# Patient Record
Sex: Male | Born: 1955 | Race: White | Hispanic: No | Marital: Single
Health system: Midwestern US, Academic
[De-identification: ages and names within clinical notes are randomized; demographics above are authoritative.]

---

## 2019-04-08 ENCOUNTER — Encounter: Admit: 2019-04-08 | Discharge: 2019-04-08

## 2019-04-08 ENCOUNTER — Encounter: Admit: 2019-04-08 | Discharge: 2019-04-09

## 2019-04-20 ENCOUNTER — Encounter: Admit: 2019-04-20 | Discharge: 2019-04-21

## 2019-04-26 ENCOUNTER — Encounter: Admit: 2019-04-26 | Discharge: 2019-04-26

## 2019-04-26 DIAGNOSIS — C4490 Unspecified malignant neoplasm of skin, unspecified: Secondary | ICD-10-CM

## 2019-04-26 DIAGNOSIS — M549 Dorsalgia, unspecified: Secondary | ICD-10-CM

## 2019-04-26 DIAGNOSIS — C159 Malignant neoplasm of esophagus, unspecified: Secondary | ICD-10-CM

## 2019-04-26 NOTE — Progress Notes
Date of Service: 04/26/2019      Subjective:             Reason for Visit:  New CA Pt      Jason Garner is a 63 y.o. male         History of Present Illness    Received records from [facility] for consult with Medical Oncology at our Cancer Center.  I reviewed the records as outlined below. I spent greater than 31 minutes reviewing the below records. The records that were obtained/reviewed/summarized are scanned into 02 in outside records / media tab / CareEverywhere tab.    Oncologic history is as summarized below.    Dysphagia - 1-2 months. Has to blend food. Has trouble with liquids. Lost 40 pounds x 2 months.     04/02/2019 EGD showed circumferential mass at the GE junction, causing near obstruction.  The scope could not be passed beyond.  It was dilated and the swelling improved partially.   Biopsy revealed an esophageal adenocarcinoma.  PD-L1 CPS score is equal to 5%.  HER-2/neu is 2+ by IHC.  FISH results pending.      CT chest abdomen pelvis on 04/08/2019.  Long segment circumferential distal esophageal wall thickening.  Pleural-parenchymal scarring with right within the right lung apex also measuring 1.9 cm spiculated suspicious mass.    04/20/2019.  PET scan No PET uptake in the right upper lobe lung nodule..  Along the distal third of the esophagus above the GE junction there is evidence of abnormal radiopharmaceutical uptake along with thickened portion of the esophagus.  SUV 14.3.  Large gastrohepatic lymph node 2.3 cm with SUV 10.3.   Calcified lymph nodes are consistent with previous granulomatous disease.  There is uptake in the subcarinal region adjacent to a calcified subcarinal lymph node SUV 5.1.    04/22/2019.  IR guided right lung core biopsy revealed a well-differentiated adenocarcinoma.  CK7 positive.  CK20 negative.  TTF-1 positive.  AE1/AE3 positive.  MART-1 negative.  The tumor demonstrates in part a lipidic pattern and is primary to the lung. 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  He is physically active.  He continues to report dysphagia.  He is grinding up all his food to swallow.  He starting to have trouble with thick liquids as well.  He continues to lose weight.           Review of Systems   Constitutional: Positive for unexpected weight change. Negative for appetite change, chills, fatigue and fever.   HENT: Positive for trouble swallowing. Negative for sore throat.    Respiratory: Negative for cough and shortness of breath.    Cardiovascular: Negative for chest pain and leg swelling.   Gastrointestinal: Negative for abdominal pain, blood in stool, constipation, diarrhea, nausea and vomiting.   Genitourinary: Negative for dysuria and hematuria.   Musculoskeletal: Negative for back pain.   Skin: Negative for rash.   Neurological: Negative for headaches.   Psychiatric/Behavioral: Negative for sleep disturbance.             Medical History:   Diagnosis Date   ??? Back pain    ??? Cancer of esophagus (HCC)    ??? Skin cancer      Surgical History:   Procedure Laterality Date   ??? COLONOSCOPY     ??? ESOPHAGUS SURGERY       Family History   Problem Relation Age of Onset   ???  Cancer Father      Social History     Socioeconomic History   ??? Marital status: Single     Spouse name: Not on file   ??? Number of children: Not on file   ??? Years of education: Not on file   ??? Highest education level: Not on file   Occupational History   ??? Not on file   Tobacco Use   ??? Smoking status: Current Every Day Smoker     Packs/day: 1.00     Years: 35.00     Pack years: 35.00     Types: Cigarettes   ??? Smokeless tobacco: Never Used   Substance and Sexual Activity   ??? Alcohol use: Yes     Comment: occasionally    ??? Drug use: Never   ??? Sexual activity: Not on file   Other Topics Concern   ??? Not on file   Social History Narrative   ??? Not on file         Objective:         ??? omeprazole DR (PRILOSEC) 20 mg capsule 20 mg.     Vitals: 04/26/19 1414   BP: 127/72   BP Source: Arm, Left Lower   Patient Position: Sitting   Temp: (!) 34.8 ???C (94.6 ???F)   Weight: 67.6 kg (149 lb)   PainSc: Six     Body mass index is 19.66 kg/m???.     Pain Score: Six  Pain Loc: Abdomen      Pain Addressed:  Current regimen working to control pain.    Patient Evaluated for a Clinical Trial: Patient not eligible for a treatment trial (including not needing treatment, needs palliative care, in remission).     [place] Cooperative Oncology Group performance status is 1, Restricted in physically strenuous activity but ambulatory and able to carry out work of a light or sedentary nature, e.g., light house work, office work.     Physical Exam  Vitals signs and nursing note reviewed.   Constitutional:       Appearance: Normal appearance. He is normal weight.   HENT:      Head: Normocephalic and atraumatic.      Nose: Nose normal.   Eyes:      Extraocular Movements: Extraocular movements intact.   Neurological:      Mental Status: He is alert and oriented to person, place, and time.   Psychiatric:         Mood and Affect: Mood normal.         Behavior: Behavior normal.         Thought Content: Thought content normal.         Judgment: Judgment normal.            CBC w/DIFF  CBC with Diff Latest Ref Rng & Units 12/10/2012   WBC 4.5 - 11.0 K/UL 6.5   RBC 4.4 - 5.5 M/UL 4.73   HGB 13.5 - 16.5 GM/DL 16.1   HCT 40 - 50 % 09.6   MCV 80 - 100 FL 95.8   MCH 26 - 34 PG 31.4   MCHC 32.0 - 36.0 G/DL 04.5   RDW 11 - 15 % 40.9   PLT 150 - 400 K/UL 279   MPV 7 - 11 FL 7.6     Comprehensive Metabolic Profile  CMP Latest Ref Rng & Units 12/10/2012   NA 137 - 147 MMOL/L 140   K 3.5 - 5.1 MMOL/L 4.2  CL 98 - 110 MMOL/L 110   CO2 21 - 30 MMOL/L 25   GAP 8 - 12 5(L)   BUN 7 - 25 MG/DL 10   CR 0.4 - 1.61 MG/DL 0.96   GLUX 70 - 045 MG/DL 409(W)   CA 8.6 - 11.9 MG/DL 9.2   TP 6.0 - 8.0 G/DL 6.9   ALB 3.5 - 5.0 G/DL 4.4   ALKP 25 - 147 U/L 67   ALT 7 - 56 U/L 22   TBILI 0.3 - 1.2 MG/DL 0.4 GFR >82 NF/AOZ/3.08 SQM >60   GFRAA >60 ML/MIN/1.73 SQM >60       Tumor Markers  No results found for: CEA, CA199, CA125    Radiologic Examinations:  No image results found.         Assessment and Plan:  This is 63 y.o. male with the following problems:       Stage III GEJ adenocarcinoma  ??? History of Barrett's esophagus  ??? Dysphagia x 2 months. Lost 40 pounds x 2 months.   ??? 04/02/2019 EGD showed circumferential mass at the GE junction, causing near obstruction.  The scope could not be passed beyond.  It was dilated and the swelling improved partially.   ??? Biopsy revealed an esophageal adenocarcinoma.  PD-L1 CPS score is equal to 5%.  HER-2/neu is 2+ by IHC.  FISH results pending.  ??? CT chest abdomen pelvis on 04/08/2019.  Long segment circumferential distal esophageal wall thickening.  Pleural-parenchymal scarring with right within the right lung apex also measuring 1.9 cm spiculated suspicious mass.  ??? 04/20/2019.  PET scan No PET uptake in the right upper lobe lung nodule..  Along the distal third of the esophagus above the GE junction there is evidence of abnormal radiopharmaceutical uptake along with thickened portion of the esophagus.  SUV 14.3.  Large gastrohepatic lymph node 2.3 cm with SUV 10.3.  ???  Calcified lymph nodes are consistent with previous granulomatous disease.  There is uptake in the subcarinal region adjacent to a calcified subcarinal lymph node SUV 5.1.  PLAN  We reviewed the diagnosis, staging, disease biology, prognosis, treatment options and goal of treatment. We discussed that the patient has stage III cancer and that our goal of treatment would be curative intent with surgery but there is a risk of recurrence.  We will review his case at our GI tumor board later this week.  We will obtain images and pathology for review.  I discussed that I agree with Dr. Lisabeth Pick plan to pursue neoadjuvant chemotherapy with FLOT  Alternatively following up HER-2 testing by [facility] we could consider adding Herceptin to FOLFOX in the neoadjuvant setting to obtain a response.  We discussed that avoiding radiation may be a good idea to to preserve lung function in the event it is surgically feasible to resect below lung cancer.  Patient would like to undergo surgery at [facility] and hence we will refer to [facility] cardiothoracic surgeon - Dr. Seth Bake.  Patient will receive chemo via Dr. Donnajean Lopes close to home  Of note patient will not be regimen for clinical trials due to primary cancers.    Dysphagia. Loss of weight.  This is from cancer.  He has significant loss of weight of 40 pounds over the last 2 months.  Hence I recommended J-tube placement by Dr. Derenda Fennel.      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  04/22/2019.  IR guided right lung core biopsy revealed a well-differentiated adenocarcinoma.  CK7  positive.  CK20 negative.  TTF-1 positive.  AE1/AE3 positive.  MART-1 negative.  The tumor demonstrates in part a lepidic pattern and is primary to the lung.  We will discuss feasibility of surgically resecting at the time of Esophagectomy versus definitive radiation.      Current smoker.  I spent more than 3 minutes counseling the patient the importance of smoking cessation and health benefits. I discussed available resources to aid in smoking cessation. Pt verbalized understanding.    History of Barrett's.  He is on PPI.      Time spent with the patient was 60 minutes, more than 50% of the time was spent towards discussing diagnosis, management, counseling regarding esophageal cancer, lung cancer, smoking, Barrett's esophagitis, dysphagia management, feeding tube placement, assimilating data for GI tumor board presentation.                         The patient/family was/were allowed to ask questions and voice concerns; these were addressed to the best of our ability. Patient/family expressed understanding of what was explained and agreed with the present plan. Patient/family has the phone numbers for the Cancer Center and was instructed on how to contact us with any questions or concerns.     Vanita Ingles MD

## 2019-04-27 ENCOUNTER — Encounter: Admit: 2019-04-27 | Discharge: 2019-04-27

## 2019-04-27 DIAGNOSIS — C3490 Malignant neoplasm of unspecified part of unspecified bronchus or lung: Secondary | ICD-10-CM

## 2019-04-27 NOTE — Telephone Encounter
Navigation Intake Assessment Document    Patient Name:  Jason Garner  DOB:  1956/09/29  Insurance:   [employer] OUT OF STATE    Appointment Info: 04/26/19 Dr. Josiah Lobo - [facility]    Diagnosis & Reason for Visit:  Malignant neoplasm of esophagus and a new lung primary - 2nd opinion    Physician Info:   ??? Referring Physician:  Dr. Lyndee Hensen  ??? Contact Name & Number:  [phone]  ??? Surgeon:    ??? Medical Oncologist:  Dr. Emmie Niemann Pendurti and Dr. Vanita Ingles    ??? PCP:  Dr. Erskine Emery - [facility]  ??? Other:   Dr. Fortunato Curling - gastroenterology  ??? Other:  Dr. Bernita Buffy    Location of Films:  Requested from [facility]    Location of Pathology:  [facility]     History of Present Illness:    Patient presents with a 1-58month history of dysphagia - Has to blend food. Has trouble with liquids. Lost 40 pounds x 2 months.     04/02/2019 EGD - see paper packet    04/08/2019 CT chest abdomen pelvis - see paper packet    04/20/2019.  PET scan - see paper packet  ???  04/22/2019.  IR guided right lung core biopsy    Prior Treatment (XRT, Surgery, Chemotherapy): None    Comments:  Spoke with patient and informed him of date, time and location of appointment. Patient verbalized understanding. Patient guide not sent as he was seen same day.    COVID-19 guidelines reviewed with patient, including: visitor and universal masking policies, and a temperature check at the facility entrance upon arrival.     NEEDS Assessment:    Genetic Counseling:  Genetic Assessment: Other (Comment)(dad had cancer)        Nutrition:  Current Weight (in pounds): 154  Eating Poorly Due to Decreased Appetite?: Yes  Additional Nutrition Assessment: Other (Comment)     Social & Financial:  Social and Financial Assessment: Reports adequate support system;Insurance Concerns;Financial Concerns;Employment issues  Tobacco assessment last 30 days: Patient has used tobacco products within the last 30 days Social and Financial Intervention: Referral placed to Social Worker;Provided information about available services    Spiritual & Emotional:  Spiritual and Emotional Assessment: Reports adequate support system;No needs identified  Spiritual and Emotional Intervention: Emotional Support provided;Provided information about available services    Physical:  Fall Risk: None identified  Pain Score: Six  Pain Loc: Abdomen  Fatigue Scale: 4     Communication:  Communication Barrier: No     Onc Fertility:   Onc Fertility Assessment: Not assessed at this time    Patient Education  Education provided to: patient  Preferred method: oral instruction;written instruction  Are learners ready to learn?: Yes  Are there barriers to learning?: No     Phase of patient's treatment: Pre-Treatment    Topics Discussed  Topics discussed: orientation to Cancer Center     Ph # given to patient for follow up: Yes    Education Details  Educated by: telephone  Ed  time: 10 min

## 2019-04-28 ENCOUNTER — Encounter: Admit: 2019-04-28 | Discharge: 2019-04-28

## 2019-04-28 DIAGNOSIS — C3490 Malignant neoplasm of unspecified part of unspecified bronchus or lung: Secondary | ICD-10-CM

## 2019-04-28 DIAGNOSIS — C159 Malignant neoplasm of esophagus, unspecified: Secondary | ICD-10-CM

## 2019-04-28 DIAGNOSIS — Z1159 Encounter for screening for other viral diseases: Secondary | ICD-10-CM

## 2019-04-28 NOTE — Progress Notes
Interventional Radiology Outpatient Scheduling Checklist      1.  Name of Procedure(s):  Port placement/ pt will be coming from [place] and will go back to [facility] for procedure w/ Dr Lavona Mound from his office called to schedule and will call pt w/ instructions.       2.  Date of Procedure: 05/07/19        3.  Arrival Time: pt coming from [facility]        4.  Procedure Time:  0900      5.  Correct Procedural Room Assignment:  IR BH rm 3      6.  Blood Thinners Triaged and instructed per protocol: Y/N/NA:  NA  Confirmed accurate instructions sent to patient: Y/N:  NA       7.  Procedure Order Verified: Y/N:  Yes      9.  Patient instructed to have a driver: Y/N/NA:  Yes    10.  Patient instructed on NPO status: Y/N/NA:  midnight  Confirmed accurate instructions sent to patient: Y/N:  Yes    11.  Specimen needed: Y/N/NA:  No   Verified Order placed: Y/N:  NA    12.  Allergies Verified:  Y/N:  Yes    13.  Is there an Iodine Allergy: Y/N:  No  Does the Procedure Require contrast: Y/N:  Yes   If so, was the IR- Contrast Allergy Pre-Procedure Medication protocol ordered: Y/NA:  NA    14.  Does the patient have labs according to IR Pre-procedure Laboratory Parameter policy: Y/N/NA:  Possible  If No, was the patient instructed to obtain labs prior to procedure: Y/N/NA:  NA     15.  Will the patient need to be admitted or have a possible admission: Y/N:  No  If yes, confirmed accurate instructions sent to patient: Y/N/NA:  NA     16.  Patient States Understanding: Y/N:  Yes    17.  History of OSA:  Y/N:  No  If yes, confirm request to bring CPAP sent to patient: Y/N/NA:  NA    18. Patient declines electronic procedure instructions: Y/N: Pt is getting instructions from [place] per [facility] w/ Dr Clayton Bibles office.

## 2019-04-28 NOTE — Progress Notes
Per Dr. Ricky Ala, patient needs PAT/Covid testing prior to consultation on 8/6 in preparation for surgery on 8/7.  Will proceed with PFTs and consult on 8/6.  Plan for Right VATS, RUL wedge, staging laparosocpy and j-tube placement on 8/7 with PORT placement in IR prior on 8/7. Orders placed and patient updated. Fortunato Curling, RN

## 2019-04-29 ENCOUNTER — Encounter: Admit: 2019-04-29 | Discharge: 2019-04-29

## 2019-04-29 DIAGNOSIS — C155 Malignant neoplasm of lower third of esophagus: Secondary | ICD-10-CM

## 2019-04-29 DIAGNOSIS — C159 Malignant neoplasm of esophagus, unspecified: Secondary | ICD-10-CM

## 2019-04-29 DIAGNOSIS — R1310 Dysphagia, unspecified: Secondary | ICD-10-CM

## 2019-04-29 DIAGNOSIS — C3490 Malignant neoplasm of unspecified part of unspecified bronchus or lung: Secondary | ICD-10-CM

## 2019-04-30 ENCOUNTER — Encounter: Admit: 2019-04-30 | Discharge: 2019-04-30

## 2019-04-30 DIAGNOSIS — C159 Malignant neoplasm of esophagus, unspecified: Secondary | ICD-10-CM

## 2019-04-30 NOTE — Progress Notes
[facility] CANCER CENTER  CANCER CONFERENCE    Tumor Conference Date: 04/30/19  Patient:             Jason Garner  Med Rec #:  [id number]  DOB:              [birth year]    Presenting Physician: Dr. Vanita Ingles  Medical Oncologist:  Dr. Vanita Ingles  Surgeon: Dr. Bryson Dames  Radiation Oncologist: n/a    Attendance: Medical Oncology, Radiation Oncology, Surgical Oncology, Radiology, Pathology    Discussion:  Prospective discussion    Reason for presentation: discuss tx options and examine films    Clinical History/Significant PMHx: The following is taken from O2 documentation by Dr. Josiah Lobo from the patient's 04/26/19 consultation with him:    Oncologic history is as summarized below.  ???  Dysphagia - 1-2 months. Has to blend food. Has trouble with liquids. Lost 40 pounds x 2 months.   ???  04/02/2019 EGD showed circumferential mass at the GE junction, causing near obstruction.  The scope could not be passed beyond.  It was dilated and the swelling improved partially.   Biopsy revealed an esophageal adenocarcinoma.  PD-L1 CPS score is equal to 5%.  HER-2/neu is 2+ by IHC.  FISH results pending.  ???  CT chest abdomen pelvis on 04/08/2019.  Long segment circumferential distal esophageal wall thickening.  Pleural-parenchymal scarring with right within the right lung apex also measuring 1.9 cm spiculated suspicious mass.  ???  04/20/2019.  PET scan No PET uptake in the right upper lobe lung nodule..  Along the distal third of the esophagus above the GE junction there is evidence of abnormal radiopharmaceutical uptake along with thickened portion of the esophagus.  SUV 14.3.  Large gastrohepatic lymph node 2.3 cm with SUV 10.3.   Calcified lymph nodes are consistent with previous granulomatous disease.  There is uptake in the subcarinal region adjacent to a calcified subcarinal lymph node SUV 5.1.  ???  04/22/2019.  IR guided right lung core biopsy revealed a well-differentiated adenocarcinoma.  CK7 positive.  CK20 negative.  TTF-1 positive.  AE1/AE3 positive.  MART-1 negative.  The tumor demonstrates in part a lipidic pattern and is primary to the lung.   ???  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  He is physically active.  He continues to report dysphagia.  He is grinding up all his food to swallow.  He starting to have trouble with thick liquids as well.  He continues to lose weight.  ???  ???  Review of Systems   Constitutional: Positive for unexpected weight change. Negative for appetite change, chills, fatigue and fever.   HENT: Positive for trouble swallowing. Negative for sore throat.    Respiratory: Negative for cough and shortness of breath.    Cardiovascular: Negative for chest pain and leg swelling.   Gastrointestinal: Negative for abdominal pain, blood in stool, constipation, diarrhea, nausea and vomiting.   Genitourinary: Negative for dysuria and hematuria.   Musculoskeletal: Negative for back pain.   Skin: Negative for rash.   Neurological: Negative for headaches.   Psychiatric/Behavioral: Negative for sleep disturbance.   ???       Medical History:   Diagnosis Date   ??? Back pain ???   ??? Cancer of esophagus (HCC) ???   ??? Skin cancer ???   ???        Surgical History:   Procedure Laterality Date   ???  COLONOSCOPY ??? ???   ??? ESOPHAGUS SURGERY ??? ???   ???        Family History   Problem Relation Age of Onset   ??? Cancer Father ???   ???  Body mass index is 19.66 kg/m???.   ???  Pain Score: Six  Pain Loc: Abdomen  ???  ???  Pain Addressed:??? Current regimen working to control pain.  ???  Patient Evaluated for a Clinical Trial: Patient not eligible for a treatment trial (including not needing treatment, needs palliative care, in remission).   ???  [place] Cooperative Oncology Group performance status is 1, Restricted in physically strenuous activity but ambulatory and able to carry out work of a light or sedentary nature, e.g., light house work, office work.  ???   Physical Exam  Vitals signs and nursing note reviewed.   Constitutional:       Appearance: Normal appearance. He is normal weight.   HENT:      Head: Normocephalic and atraumatic.      Nose: Nose normal.   Eyes:      Extraocular Movements: Extraocular movements intact.   Neurological:      Mental Status: He is alert and oriented to person, place, and time.   Psychiatric:         Mood and Affect: Mood normal.         Behavior: Behavior normal.         Thought Content: Thought content normal.         Judgment: Judgment normal.     ???  CBC w/DIFF  CBC with Diff Latest Ref Rng & Units 12/10/2012   WBC 4.5 - 11.0 K/UL 6.5   RBC 4.4 - 5.5 M/UL 4.73   HGB 13.5 - 16.5 GM/DL 16.1   HCT 40 - 50 % 09.6   MCV 80 - 100 FL 95.8   MCH 26 - 34 PG 31.4   MCHC 32.0 - 36.0 G/DL 04.5   RDW 11 - 15 % 40.9   PLT 150 - 400 K/UL 279   MPV 7 - 11 FL 7.6   ???  Comprehensive Metabolic Profile  CMP Latest Ref Rng & Units 12/10/2012   NA 137 - 147 MMOL/L 140   K 3.5 - 5.1 MMOL/L 4.2   CL 98 - 110 MMOL/L 110   CO2 21 - 30 MMOL/L 25   GAP 8 - 12 5(L)   BUN 7 - 25 MG/DL 10   CR 0.4 - 8.11 MG/DL 9.14   GLUX 70 - 782 MG/DL 956(O)   CA 8.6 - 13.0 MG/DL 9.2   TP 6.0 - 8.0 G/DL 6.9   ALB 3.5 - 5.0 G/DL 4.4   ALKP 25 - 865 U/L 67   ALT 7 - 56 U/L 22   TBILI 0.3 - 1.2 MG/DL 0.4   GFR >78 IO/NGE/9.52 SQM >60   GFRAA >60 ML/MIN/1.73 SQM >60   ???  ???  Tumor Markers  No results found for: CEA, CA199, CA125  ???  Radiologic Examinations:  No image results found.  ???  ???  Assessment and Plan:  This is 63 y.o. male with the following problems:  ???  Stage III GEJ adenocarcinoma  ??? History of Barrett's esophagus  ??? Dysphagia x 2 months. Lost 40 pounds x 2 months.   ??? 04/02/2019 EGD showed circumferential mass at the GE junction, causing near obstruction.  The scope could not be passed beyond.  It was dilated and the  swelling improved partially. ??? Biopsy revealed an esophageal adenocarcinoma.  PD-L1 CPS score is equal to 5%.  HER-2/neu is 2+ by IHC.  FISH results pending.  ??? CT chest abdomen pelvis on 04/08/2019.  Long segment circumferential distal esophageal wall thickening.  Pleural-parenchymal scarring with right within the right lung apex also measuring 1.9 cm spiculated suspicious mass.  ??? 04/20/2019.  PET scan No PET uptake in the right upper lobe lung nodule..  Along the distal third of the esophagus above the GE junction there is evidence of abnormal radiopharmaceutical uptake along with thickened portion of the esophagus.  SUV 14.3.  Large gastrohepatic lymph node 2.3 cm with SUV 10.3.  ???  Calcified lymph nodes are consistent with previous granulomatous disease.  There is uptake in the subcarinal region adjacent to a calcified subcarinal lymph node SUV 5.1.  PLAN  We reviewed the diagnosis, staging, disease biology, prognosis, treatment options and goal of treatment. We discussed that the patient has stage III cancer and that our goal of treatment would be curative intent with surgery but there is a risk of recurrence.  We will review his case at our GI tumor board later this week.  We will obtain images and pathology for review.  I discussed that I agree with Dr. Lisabeth Pick plan to pursue neoadjuvant chemotherapy with FLOT  Alternatively following up HER-2 testing by [facility] we could consider adding Herceptin to FOLFOX in the neoadjuvant setting to obtain a response.  We discussed that avoiding radiation may be a good idea to to preserve lung function in the event it is surgically feasible to resect below lung cancer.  Patient would like to undergo surgery at [facility] and hence we will refer to [facility] cardiothoracic surgeon - Dr. Seth Bake.  Patient will receive chemo via Dr. Donnajean Lopes close to home  Of note patient will not be regimen for clinical trials due to primary cancers.  ???  Dysphagia. Loss of weight.  This is from cancer.  He has significant loss of weight of 40 pounds over the last 2 months.  Hence I recommended J-tube placement by Dr. Derenda Fennel.  ???  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  04/22/2019.  IR guided right lung core biopsy revealed a well-differentiated adenocarcinoma.  CK7 positive.  CK20 negative.  TTF-1 positive.  AE1/AE3 positive.  MART-1 negative.  The tumor demonstrates in part a lepidic pattern and is primary to the lung.  We will discuss feasibility of surgically resecting at the time of Esophagectomy versus definitive radiation.  ???  Current smoker.  I spent more than 3 minutes counseling the patient the importance of smoking cessation and health benefits. I discussed available resources to aid in smoking cessation. Pt verbalized understanding.  ???  History of Barrett's.  He is on PPI.  ???  Staging: Cancer Staging  No matching staging information was found for the patient.    RECOMMENDATIONS:    Imaging reviewed. Felt that the patient has a second primary lung cancer. Felt to be low stage.     Recommending neoadjuvant systemic therapy vs chemotradiation therapy, recommending FLOT (consider regimen +/- herceptin/Perjeta) followed by resection of GE junction tumor at the time of lung tumor resection.    NCCN or other national guidelines discussed:  Yes    Candidate for open clinical trial discussed:  No    Staging discussed:  Yes    Options and eligibility for genetic testing discussed:No    Options and eligibility for supportive care services discussed: Yes

## 2019-05-03 ENCOUNTER — Encounter: Admit: 2019-05-03 | Discharge: 2019-05-03

## 2019-05-03 DIAGNOSIS — C159 Malignant neoplasm of esophagus, unspecified: Secondary | ICD-10-CM

## 2019-05-03 DIAGNOSIS — C3490 Malignant neoplasm of unspecified part of unspecified bronchus or lung: Secondary | ICD-10-CM

## 2019-05-03 DIAGNOSIS — C3491 Malignant neoplasm of unspecified part of right bronchus or lung: Secondary | ICD-10-CM

## 2019-05-03 NOTE — Progress Notes
Date of Service: 05/06/2019    Name: Jason Garner          DOB: Jun 06, 1956          MRN: [id number]    Referring Provider: Vanita Ingles  PCP: Erskine Emery    Chief Complaint   Patient presents with   ??? New Patient     esophageal and lung cancer     PCP note in G drive    ??? Referring Physician:  Dr. Vanita Ingles  ??? Medical Oncologist: Dr. Donnajean Lopes  ??? PCP:  Dr. Erskine Emery (g-drive)  ???  History of Present Illness  Jason Garner is a 63 y.o. male who presents to thoracic surgery clinic for evaluation of new diagnosis of lung cancer and esophageal adenocarcinoma. Planning for R VATs RUL wedge, staging lap, J tube placement, and port placement this week.     Overall his symptoms have included dysphagia, pain. Down to 2-3 cigarettes per day.       Work up summary:   History of Present Illness:  Patient is a 63 year old male with a history of  COPD, Barrett's esophagus, melanoma and is a current smoker. Presented to his PCP in June with complaints of food getting stuck in his lower esophagus, tried to drink water to help it go down, would end up vomiting after that, has had a 40 pound weight loss. CT done showing esophageal wall thickening. EGD biopsy coming back as adenocarcinoma. Will follow with Dr. Donnajean Lopes for chemotherapy. CT scan also showed a 1.9 cm spiculated mass, biopsy revealed a well differentiated adenocarcinoma. Referral to Dr. Derenda Fennel for surgical evaluation.   ???  04/02/2019-EGD with balloon dilation:   1. Gastroesophageal junction, biopsy:   - Adenocarcinoma  Per outside report, HER-2 immunostain shows equivocal result (score 2+).   HER-2 FISH result is positive (amplified).   PD L1 immunostain shows expressed PD L1 with 5% positivity.  ???  04/08/2019-CT C/A/P([facility]):   ???  04/20/2019-PET [facility]): Impression:  Focal area of uptake in the distal esophagus and along the known lesion is identified with an SUV max of 14.3. There is evidence of adenopathy in the intrahepatic ligament with an enlarged 2.1 x 2.3 cm lymph node with an SUV max of 10.3. there is uptake int eh subcarinal [artially calcified lymph node with an SUv max of 5.1. The nodule identified in the right upper lobe on recent CT scan does not demonstrate avidity for the radiopharmaceutical.   ???  04/22/2019.  IR guided right lung core biopsy revealed a well-differentiated adenocarcinoma.  CK7 positive.  CK20 negative.  TTF-1 positive.  AE1/AE3 positive.  MART-1 negative.  The tumor demonstrates in part a lipidic pattern and is primary to the lung.   ???  05/06/2019 PFT   FEV1-%Pred-Pre 51    %     DLCOunc-%Pred-Pre 90    %         History   Medical History:   Diagnosis Date   ??? Back pain    ??? Barrett's esophagus    ??? Cancer of esophagus (HCC)    ??? COPD (chronic obstructive pulmonary disease) (HCC) 05/05/2019   ??? Dysphagia    ??? Esophageal cancer (HCC)    ??? Melanoma (HCC)    ??? Skin cancer     Excision forehead, right   ??? Weight loss, unintentional      Surgical History:   Procedure Laterality Date   ??? UPPER GASTROINTESTINAL ENDOSCOPY  04/02/2019   ???  COLONOSCOPY     ??? SKIN CANCER EXCISION     ??? TONSILLECTOMY       Social History     Socioeconomic History   ??? Marital status: Single     Spouse name: Not on file   ??? Number of children: Not on file   ??? Years of education: Not on file   ??? Highest education level: Not on file   Occupational History   ??? Not on file   Tobacco Use   ??? Smoking status: Current Every Day Smoker     Packs/day: 1.00     Years: 35.00     Pack years: 35.00     Types: Cigarettes   ??? Smokeless tobacco: Current User     Types: Chew   ??? Tobacco comment: 2-3 cigs a day   Substance and Sexual Activity   ??? Alcohol use: Not Currently     Comment: occasionally    ??? Drug use: Never   ??? Sexual activity: Not on file   Other Topics Concern   ??? Not on file   Social History Narrative   ??? Not on file         Family History   Problem Relation Age of Onset   ??? Stomach Cancer Father    ??? Stroke Mother ??? Heart Attack Mother    ??? Brain Cancer Sister    ??? Heart Attack Sister    ??? Lung Disease Sister    ??? Mental Illness Sister    ??? Suicide Brother    ??? Other Brother         MVA     Allergies   Allergen Reactions   ??? Cephalexin RASH     Per patient has tolerated penicillins before       There is no immunization history on file for this patient.      Medications:   No current outpatient medications on file.           Anti-coagulation: none    Functional Assessment   ECOG Performance Status: 0, Fully active, able to carry on all pre-disease performance without restriction.   Pain Scale (0 = No Pain, 10 = Severe Pain): 0  Comments/Interventions: na    Clinical Nutrition Status  PO Intake compared to normal: Dysphagia to solids and liquids  Weight loss last 3 months: 50 lbs  Estimated body mass index is 21.2 kg/m??? as calculated from the following:    Height as of this encounter: 1.803 m (5' 11).    Weight as of this encounter: 68.9 kg (152 lb).  BMI class: Acceptable (19 to <25)  Patient demonstrates moderate malnutrition      ROS    Constitution: Negative.   HENT: Negative.    Eyes: Negative.    Cardiovascular: Positive for chest pain and dyspnea on exertion.   Respiratory: Negative.    Endocrine: Negative.    Hematologic/Lymphatic: Negative.    Skin: Negative.    Musculoskeletal: Negative.    Gastrointestinal: Positive for abdominal pain and vomiting.   Genitourinary: Negative.    Neurological: Negative.    Psychiatric/Behavioral: Negative.    Allergic/Immunologic: Negative.      Physical Exam   Constitutional: He appears well-developed and well-nourished.   HENT:   Head: Normocephalic and atraumatic.   Eyes: Conjunctivae and EOM are normal.   Neck: Neck supple.   Cardiovascular: Normal rate, regular rhythm and normal heart sounds.   Pulmonary/Chest: Effort normal and breath sounds normal.  Neurological: He is alert and oriented to person, place, and time. Psychiatric: He has a normal mood and affect. His behavior is normal. Judgment and thought content normal.       Objective  Vitals:    05/06/19 1606   BP: 112/70   Pulse: 89   Temp: 37.2 ???C (99 ???F)   SpO2: 98%           Primary Diagnosis:   Encounter Diagnoses   Name Primary?   ??? Adenocarcinoma of right lung (HCC) Yes   ??? Malignant neoplasm of lower third of esophagus [facility])          Currently Active Problems:  Patient Active Problem List    Diagnosis Date Noted   ??? Abnormal weight loss 05/05/2019   ??? Dysphagia, unspecified 05/05/2019   ??? Other spondylosis, lumbosacral region 05/05/2019   ??? Malignant neoplasm of lower third of esophagus (HCC) 05/05/2019   ??? COPD (chronic obstructive pulmonary disease) (HCC) 05/05/2019   ??? Adenocarcinoma of right lung (HCC) 05/05/2019         Path Results:        Clinical Staging:  Cancer Staging  No matching staging information was found for the patient.    Tumor is malignant    Assessment and Plan  1. Adenocarcinoma of right lung (HCC)    2. Malignant neoplasm of lower third of esophagus (HCC)        Proceed with staging lap, J tube, and port placement per IR. Cancel R vats wedge per request of the oncology team. Quit smoking.     Micheal Likens, APRN  Thoracic Surgery and Lung Cancer Screening      I personally performed the key portions of the E/M visit, discussed case with Nurse Practitioner and concur with documentation of history, physical exam, assessment, and treatment plan unless otherwise noted.    My synopsis:  Mr. Dutton is a 63 year old patient I already reviewed with Dr. Josiah Lobo as well as Dr. Hollace Kinnier.  He has locally advanced esophageal cancer.  He also has a right upper lobe adenocarcinoma.  Tumor of the lung appears to be of lung origin.  The patient has had significant dysphasia and has been unable to eat for several weeks and has had profound weight loss.  The endoscope could not be passed the tumor, and therefore an EUS was not performed.  Patient is an active smoker and he has poor lung function at baseline with an FEV1 in the 58s.  I recommended that we pursue a staging laparoscopy, as well as placement of a jejunal feeding tube in addition to placement of a port.  We will coordinate all of these findings tomorrow.  I had originally planned on offering the patient a right vats wedge resection at the same time, however, the oncology team wishes to defer this till the time of a right thoracotomy for his esophagectomy.  This is certainly a reasonable approach.  A lengthy discussion with the patient overall regarding his clinical staging and overall expected outcomes.  He is eager to pursue therapy.  We will plan on proceeding with the above operation tomorrow.      Bryson Dames, MD  Thoracic Surgery

## 2019-05-04 ENCOUNTER — Encounter: Admit: 2019-05-04 | Discharge: 2019-05-04

## 2019-05-04 DIAGNOSIS — C3490 Malignant neoplasm of unspecified part of unspecified bronchus or lung: Secondary | ICD-10-CM

## 2019-05-04 MED ORDER — HEPARIN, PORCINE (PF) 5,000 UNIT/0.5 ML IJ SYRG
5000 [IU] | Freq: Once | SUBCUTANEOUS | 0 refills | Status: CN
Start: 2019-05-04 — End: ?

## 2019-05-04 MED ORDER — SODIUM CHLORIDE 0.9 % IV SOLP
INTRAVENOUS | 0 refills | Status: CN
Start: 2019-05-04 — End: ?

## 2019-05-04 MED ORDER — LIDOCAINE (PF) 10 MG/ML (1 %) IJ SOLN
.1-2 mL | INTRAMUSCULAR | 0 refills | Status: CN | PRN
Start: 2019-05-04 — End: ?

## 2019-05-04 NOTE — Telephone Encounter
Per Dr. Eldred Manges I called [name] to see about setting up tele health appt with pt, Jason Garner was sleeping and will call back with when would be a good time due to having several appts set up for the rest of this week.

## 2019-05-04 NOTE — Telephone Encounter
Navigation Intake Assessment Document    Patient Name:  Jason Garner  DOB:  Nov 01, 1955  Insurance:   BCBS    Appointment Info:   Future Appointments   Date Time Provider Department Center   05/05/2019 11:45 AM [place] LAB TECH [facility] CVM Procedur   05/05/2019 12:00 PM PREOPNURSECTS [facility]   05/05/2019 12:30 PM PAC ROOM 2 PRE None   05/05/2019  2:20 PM SPEC SCREENING NURSE [facility]   05/06/2019  2:00 PM PF LAB SCHEDULE A [id number] None   05/06/2019  4:00 PM Veeramachaneni, Nirmal, MD [facility]   05/07/2019  9:00 AM IR ROOM 3 IR Interv Radio     Diagnosis & Reason for Visit:  Esophageal Cancer    Physician Info:   ??? Referring Physician:  Dr. Vanita Ingles  ??? Medical Oncologist: Dr. Donnajean Lopes (g[address]drive)  ??? PCP:  Dr. Erskine Emery (g-drive)    Location of Films:  IN HOUSE    Location of Pathology:  Outside path read here.     History of Present Illness:  Patient is a 63 year old male with a history of  COPD, Barrett's esophagus, melanoma and is a current smoker. Presented to his PCP in June with complaints of food getting stuck in his lower esophagus, and vomiting after trying to drink water to help get food down. Also had a 40 pound weight loss. CT done showing esophageal wall thickening. EGD biopsy coming back as adenocarcinoma. Will follow with Dr. Donnajean Lopes for chemotherapy. CT scan also showed a 1.9 cm spiculated mass, biopsy revealed a well differentiated adenocarcinoma. Referral to Dr. Derenda Fennel for surgical evaluation.     04/02/2019-EGD with balloon dilation:   1. Gastroesophageal junction, biopsy:   - Adenocarcinoma  Per outside report, HER-2 immunostain shows equivocal result (score 2+).   HER-2 FISH result is positive (amplified).   PD L1 immunostain shows expressed PD L1 with 5% positivity.    04/08/2019-CT C/A/P: Reports have been requested.    04/20/2019-PET [facility]): Impression:  Focal area of uptake in the distal esophagus and along the known lesion is identified with an SUV max of 14.3. There is evidence of adenopathy in the intrahepatic ligament with an enlarged 2.1 x 2.3 cm lymph node with an SUV max of 10.3. there is uptake int the subcarinal [partially calcified lymph node with an SUV max of 5.1. The nodule identified in the right upper lobe on recent CT scan does not demonstrate avidity for the radiopharmaceutical.     04/22/2019-Biopsy: Outside case [id number]   Lung mass, right, core biopsy:   Adenocarcinoma consistent with pulmonary primary.    Prior Treatment (XRT, Surgery, Chemotherapy):  Per patient he has had melanoma spots on his chest and face removed, the last time was 10 years ago. Discussed appointments with patient and his wife, answered questions, have my phone number if they need anything else.     Comments:  COVID-19 guidelines reviewed with patient, including: visitor and universal masking policies, and a temperature check at the facility entrance upon arrival. Questions answered, discussed up coming appointments.

## 2019-05-04 NOTE — Telephone Encounter
Patient call in response from call received from [place] .   This nurse inquired Patient availability for Tele health appointment w/ Dr Eldred Manges either today or tomorrow. Patient preference was to schedule on 8/5 . Appointment scheduled for 0800 tomorrow per Patient request . Dr Eldred Manges notified of scheduled appointment

## 2019-05-05 ENCOUNTER — Ambulatory Visit: Admit: 2019-05-05 | Discharge: 2019-05-06

## 2019-05-05 ENCOUNTER — Encounter: Admit: 2019-05-05 | Discharge: 2019-05-05

## 2019-05-05 ENCOUNTER — Ambulatory Visit: Admit: 2019-05-05 | Discharge: 2019-05-05

## 2019-05-05 DIAGNOSIS — C155 Malignant neoplasm of lower third of esophagus: Principal | ICD-10-CM

## 2019-05-05 DIAGNOSIS — C159 Malignant neoplasm of esophagus, unspecified: Secondary | ICD-10-CM

## 2019-05-05 DIAGNOSIS — Z1159 Encounter for screening for other viral diseases: Secondary | ICD-10-CM

## 2019-05-05 DIAGNOSIS — C3411 Malignant neoplasm of upper lobe, right bronchus or lung: Secondary | ICD-10-CM

## 2019-05-05 DIAGNOSIS — M549 Dorsalgia, unspecified: Secondary | ICD-10-CM

## 2019-05-05 DIAGNOSIS — C3490 Malignant neoplasm of unspecified part of unspecified bronchus or lung: Secondary | ICD-10-CM

## 2019-05-05 DIAGNOSIS — K2270 Barrett's esophagus without dysplasia: Secondary | ICD-10-CM

## 2019-05-05 DIAGNOSIS — C160 Malignant neoplasm of cardia: Secondary | ICD-10-CM

## 2019-05-05 DIAGNOSIS — F17210 Nicotine dependence, cigarettes, uncomplicated: Secondary | ICD-10-CM

## 2019-05-05 DIAGNOSIS — C3491 Malignant neoplasm of unspecified part of right bronchus or lung: Secondary | ICD-10-CM

## 2019-05-05 DIAGNOSIS — C439 Malignant melanoma of skin, unspecified: Secondary | ICD-10-CM

## 2019-05-05 DIAGNOSIS — J449 Chronic obstructive pulmonary disease, unspecified: Secondary | ICD-10-CM

## 2019-05-05 DIAGNOSIS — R1310 Dysphagia, unspecified: Secondary | ICD-10-CM

## 2019-05-05 DIAGNOSIS — C4490 Unspecified malignant neoplasm of skin, unspecified: Secondary | ICD-10-CM

## 2019-05-05 DIAGNOSIS — R634 Abnormal weight loss: Secondary | ICD-10-CM

## 2019-05-05 LAB — PROTIME INR (PT): Lab: 1 g/dL (ref 0.8–1.2)

## 2019-05-05 LAB — COMPREHENSIVE METABOLIC PANEL
Lab: 0.7 mg/dL (ref 0.3–1.2)
Lab: 0.7 mg/dL (ref 0.4–1.24)
Lab: 103 mg/dL — ABNORMAL HIGH (ref 70–100)
Lab: 104 MMOL/L (ref 98–110)
Lab: 11 (ref 3–12)
Lab: 12 U/L (ref 7–40)
Lab: 140 MMOL/L (ref 137–147)
Lab: 25 MMOL/L (ref 21–30)
Lab: 3.9 MMOL/L — ABNORMAL LOW (ref 3.5–5.1)
Lab: 4.3 g/dL (ref 3.5–5.0)
Lab: 5 mg/dL — ABNORMAL LOW (ref 7–25)
Lab: 7.1 g/dL (ref 6.0–8.0)
Lab: 80 U/L (ref 25–110)
Lab: 9 U/L (ref 7–56)
Lab: 9.6 mg/dL (ref 8.5–10.6)
Lab: >60 mL/min (ref >60)
Lab: >60 mL/min (ref >60)

## 2019-05-05 LAB — CBC
Lab: 13 % (ref 11–15)
Lab: 32 pg (ref 26–34)
Lab: 34 g/dL (ref 32.0–36.0)
Lab: 4.6 M/UL (ref 4.4–5.5)
Lab: 43 % (ref 40–50)
Lab: 8 K/UL (ref 4.5–11.0)

## 2019-05-05 LAB — PTT (APTT): Lab: 32 s (ref 24.0–36.5)

## 2019-05-05 NOTE — Progress Notes
Pre-op instructions given verbally and written (A Patient's Guide to Preparing for Surgery). Pt verbalized understanding. Pt told arrival time/location (0630 8/7), NPO at [place], and CHG 4% shower X 2 (provided written shower instructions). Pt taken to registration and now in [facility].    Pt to get covid test today. Pt sees Dr. Clayton Bibles and has PFTs scheduled for 8/6.

## 2019-05-05 NOTE — Progress Notes
PAC Beta Blocker Instructions Note:    Jason Garner was seen in the [facility] on 05/05/2019.  As part of the visit, an accurate medication list was obtained and the patient was given pre-op medication instructions for upcoming surgery on 05/07/19.      [name] is not currently taking a beta blocker.     Lorie Apley, PHARMD

## 2019-05-05 NOTE — Progress Notes
Patient arrived to [place] clinic for COVID-19 testing on 05/05/19 at 1407. Patient identity confirmed via photo I.D. Nasopharyngeal procedure explained to the patient.   Nasopharyngeal swab completed - left nare.  Patient education provided given and instructed patient self isolate until contacted w/ results and further instructions.   Swab collected by Gabriel Earing, RN.    Date symptoms began/reason for testing: Pre-op

## 2019-05-05 NOTE — Pre-Anesthesia Patient Instructions
GENERAL INFORMATION    Coronavirus (COVID19) Information  If you get sick with fever (100.36F/38C or higher), cough, or have trouble breathing:  ? Call your primary care physician for questions or health needs  ? Tell your doctor about any recent travel and your symptoms.  ? Avoid contact with others.  ? Notify Designer, industrial/product.  For up to date information on the Coronavirus, visit the CDC website at [employer].    For the safety of all patients, visitors and staff as we work to contain COVID-19, we must dramatically restrict patient visitors.      Current Visitor Policy (03/04/19):  One visitor per patient per day.  Exceptions include:    Two parents/guardian for patients younger than 52.  End-of-life patients may be allowed additional support persons.  Visitors should check with the patient's nurse.  Only cancer patients at their exam visit may have one visitor with them.  No visitors are allowed for cancer patients receiving treatment/infusion services.  This applies at all cancer center locations.  Restrictions still apply for patients who test positive for COVID-19 (no visitors).    Visitors will continue to be screened at all entrances.  They must be free of fever and symptoms to be in our facilities.  We ask visitors to follow these guidelines:  Wear a mask at all times.  Go directly to the nursing station in the unit you are visiting and do not linger in public areas.  Check in at the nursing station before going to the patient's room.  Maintain a physical distance of six feet from all others.  Follow elevator restrictions to four riding at a time - peak times are 6:30-7:30 a.m., noon and 6:30-7:30 p.m.  Be aware cafeteria peak times are 11 a.m. - 1 p.m.  Wash your hands frequently and cover your coughs and sneezes.    You will need to have a COVID19 test performed 2-3 days prior to your surgery.  Your COVID19 test is scheduled for 05/05/19 at 2:20pm.  Your test will be performed at a drive-thru clinic at the [facility].  If you are also planning to have lab work drawn while at the campus, please do so first and then have your COVID19 test completed second.    [facility]  [address]  [place], [place] [address]    Please bring a cell phone, your photo ID and your insurance card.  Please use the bathroom prior to your trip to the [facility] for your test.    Once arrived at the [facility] drive-thru clinic, follow the signage/cones to a parking spot.  Park and call [phone] to check in.  The team will come out to you.  You do not have to get out of your vehicle.  Our team members will collect the test sample using a swab and will be wearing all of the necessary personal protective equipment, so you do not need to wear a mask.  Once the test is performed it will be important to self quarantine at home until your surgery.  Please stay at home, wash your hands frequently, and practice physical distancing.

## 2019-05-06 ENCOUNTER — Ambulatory Visit: Admit: 2019-05-06 | Discharge: 2019-05-06

## 2019-05-06 ENCOUNTER — Encounter: Admit: 2019-05-06 | Discharge: 2019-05-06

## 2019-05-06 DIAGNOSIS — R1310 Dysphagia, unspecified: Secondary | ICD-10-CM

## 2019-05-06 DIAGNOSIS — J449 Chronic obstructive pulmonary disease, unspecified: Secondary | ICD-10-CM

## 2019-05-06 DIAGNOSIS — K2270 Barrett's esophagus without dysplasia: Secondary | ICD-10-CM

## 2019-05-06 DIAGNOSIS — R634 Abnormal weight loss: Secondary | ICD-10-CM

## 2019-05-06 DIAGNOSIS — C159 Malignant neoplasm of esophagus, unspecified: Secondary | ICD-10-CM

## 2019-05-06 DIAGNOSIS — M549 Dorsalgia, unspecified: Secondary | ICD-10-CM

## 2019-05-06 DIAGNOSIS — C155 Malignant neoplasm of lower third of esophagus: Secondary | ICD-10-CM

## 2019-05-06 DIAGNOSIS — C3490 Malignant neoplasm of unspecified part of unspecified bronchus or lung: Secondary | ICD-10-CM

## 2019-05-06 DIAGNOSIS — C3491 Malignant neoplasm of unspecified part of right bronchus or lung: Secondary | ICD-10-CM

## 2019-05-06 DIAGNOSIS — C4490 Unspecified malignant neoplasm of skin, unspecified: Secondary | ICD-10-CM

## 2019-05-06 DIAGNOSIS — C439 Malignant melanoma of skin, unspecified: Secondary | ICD-10-CM

## 2019-05-06 LAB — COVID-19 (SARS-COV-2) PCR

## 2019-05-07 ENCOUNTER — Encounter: Admit: 2019-05-07 | Discharge: 2019-05-07

## 2019-05-07 DIAGNOSIS — C159 Malignant neoplasm of esophagus, unspecified: Secondary | ICD-10-CM

## 2019-05-07 DIAGNOSIS — J449 Chronic obstructive pulmonary disease, unspecified: Secondary | ICD-10-CM

## 2019-05-07 DIAGNOSIS — C155 Malignant neoplasm of lower third of esophagus: Principal | ICD-10-CM

## 2019-05-07 DIAGNOSIS — R634 Abnormal weight loss: Secondary | ICD-10-CM

## 2019-05-07 DIAGNOSIS — M549 Dorsalgia, unspecified: Secondary | ICD-10-CM

## 2019-05-07 DIAGNOSIS — C4490 Unspecified malignant neoplasm of skin, unspecified: Secondary | ICD-10-CM

## 2019-05-07 DIAGNOSIS — R1310 Dysphagia, unspecified: Secondary | ICD-10-CM

## 2019-05-07 DIAGNOSIS — K2270 Barrett's esophagus without dysplasia: Secondary | ICD-10-CM

## 2019-05-07 DIAGNOSIS — C439 Malignant melanoma of skin, unspecified: Secondary | ICD-10-CM

## 2019-05-07 MED ORDER — HALOPERIDOL LACTATE 5 MG/ML IJ SOLN
1 mg | Freq: Once | INTRAVENOUS | 0 refills | Status: DC | PRN
Start: 2019-05-07 — End: 2019-05-07

## 2019-05-07 MED ORDER — NICOTINE 21 MG/24 HR TD PT24
1 | TRANSDERMAL | 0 refills | Status: DC
Start: 2019-05-07 — End: 2019-05-11
  Administered 2019-05-07 – 2019-05-09 (×3): 1 via TRANSDERMAL

## 2019-05-07 MED ORDER — POTASSIUM CHLORIDE 20 MEQ/15 ML PO LIQD
40-60 meq | NASOGASTRIC | 0 refills | Status: DC | PRN
Start: 2019-05-07 — End: 2019-05-11

## 2019-05-07 MED ORDER — LIDOCAINE (PF) 10 MG/ML (1 %) IJ SOLN
.1-2 mL | INTRAMUSCULAR | 0 refills | Status: DC | PRN
Start: 2019-05-07 — End: 2019-05-07

## 2019-05-07 MED ORDER — MIDAZOLAM 1 MG/ML IJ SOLN
0 refills | Status: CP
Start: 2019-05-07 — End: ?
  Administered 2019-05-07: 14:00:00 1 mg via INTRAVENOUS

## 2019-05-07 MED ORDER — ONDANSETRON HCL (PF) 4 MG/2 ML IJ SOLN
INTRAVENOUS | 0 refills | Status: DC
Start: 2019-05-07 — End: 2019-05-07
  Administered 2019-05-07: 21:00:00 4 mg via INTRAVENOUS

## 2019-05-07 MED ORDER — ONDANSETRON HCL (PF) 4 MG/2 ML IJ SOLN
4-8 mg | INTRAVENOUS | 0 refills | Status: DC | PRN
Start: 2019-05-07 — End: 2019-05-11
  Administered 2019-05-08 – 2019-05-09 (×2): 4 mg via INTRAVENOUS

## 2019-05-07 MED ORDER — SODIUM CHLORIDE 0.9 % IV SOLP
INTRAVENOUS | 0 refills | Status: AC
Start: 2019-05-07 — End: ?
  Administered 2019-05-07 (×2): 1000.000 mL via INTRAVENOUS

## 2019-05-07 MED ORDER — ROCURONIUM 10 MG/ML IV SOLN
INTRAVENOUS | 0 refills | Status: DC
Start: 2019-05-07 — End: 2019-05-07
  Administered 2019-05-07: 21:00:00 30 mg via INTRAVENOUS

## 2019-05-07 MED ORDER — ONDANSETRON HCL 4 MG PO TAB
4-8 mg | ORAL | 0 refills | Status: DC | PRN
Start: 2019-05-07 — End: 2019-05-11

## 2019-05-07 MED ORDER — ACETAMINOPHEN 1,000 MG/100 ML (10 MG/ML) IV SOLN
0 refills | Status: DC
Start: 2019-05-07 — End: 2019-05-07
  Administered 2019-05-07: 21:00:00 1000 mg via INTRAVENOUS

## 2019-05-07 MED ORDER — FENTANYL CITRATE (PF) 50 MCG/ML IJ SOLN
0 refills | Status: CP
Start: 2019-05-07 — End: ?
  Administered 2019-05-07: 14:00:00 50 ug via INTRAVENOUS

## 2019-05-07 MED ORDER — MIDAZOLAM 1 MG/ML IJ SOLN
1 mg | Freq: Once | INTRAVENOUS | 0 refills | Status: CP
Start: 2019-05-07 — End: ?
  Administered 2019-05-07: 14:00:00 1 mg via INTRAVENOUS

## 2019-05-07 MED ORDER — POTASSIUM CHLORIDE IN WATER 10 MEQ/50 ML IV PGBK
10 meq | INTRAVENOUS | 0 refills | Status: DC | PRN
Start: 2019-05-07 — End: 2019-05-11

## 2019-05-07 MED ORDER — PANTOPRAZOLE 40 MG IV SOLR
40 mg | Freq: Two times a day (BID) | INTRAVENOUS | 0 refills | Status: DC
Start: 2019-05-07 — End: 2019-05-11
  Administered 2019-05-08 – 2019-05-10 (×6): 40 mg via INTRAVENOUS

## 2019-05-07 MED ORDER — HEPARIN, PORCINE (PF) 5,000 UNIT/0.5 ML IJ SYRG
5000 [IU] | Freq: Once | SUBCUTANEOUS | 0 refills | Status: CP
Start: 2019-05-07 — End: ?
  Administered 2019-05-07: 13:00:00 5000 [IU] via SUBCUTANEOUS

## 2019-05-07 MED ORDER — CEFAZOLIN 1 GRAM IJ SOLR
0 refills | Status: DC
Start: 2019-05-07 — End: 2019-05-08
  Administered 2019-05-07: 21:00:00 2 g via INTRAVENOUS

## 2019-05-07 MED ORDER — FENTANYL CITRATE (PF) 50 MCG/ML IJ SOLN
0 refills | Status: DC
Start: 2019-05-07 — End: 2019-05-07
  Administered 2019-05-07: 21:00:00 25 ug via INTRAVENOUS
  Administered 2019-05-07: 21:00:00 75 ug via INTRAVENOUS

## 2019-05-07 MED ORDER — MINERAL OIL MISC OIL
0 refills | Status: DC
Start: 2019-05-07 — End: 2019-05-07
  Administered 2019-05-07: 21:00:00 10 mL via TOPICAL

## 2019-05-07 MED ORDER — NALOXONE 0.4 MG/ML IJ SOLN
.08 mg | INTRAVENOUS | 0 refills | Status: DC | PRN
Start: 2019-05-07 — End: 2019-05-12

## 2019-05-07 MED ORDER — LACTATED RINGERS IV SOLP
INTRAVENOUS | 0 refills | Status: AC
Start: 2019-05-07 — End: ?
  Administered 2019-05-08: 19:00:00 1000.000 mL via INTRAVENOUS

## 2019-05-07 MED ORDER — LIDOCAINE (PF) 200 MG/10 ML (2 %) IJ SYRG
0 refills | Status: DC
Start: 2019-05-07 — End: 2019-05-07
  Administered 2019-05-07: 21:00:00 80 mg via INTRAVENOUS

## 2019-05-07 MED ORDER — SUGAMMADEX 100 MG/ML IV SOLN
INTRAVENOUS | 0 refills | Status: DC
Start: 2019-05-07 — End: 2019-05-07
  Administered 2019-05-07: 22:00:00 20 mg via INTRAVENOUS

## 2019-05-07 MED ORDER — POLYETHYLENE GLYCOL 3350 17 GRAM PO PWPK
1 | Freq: Two times a day (BID) | ORAL | 0 refills | Status: DC
Start: 2019-05-07 — End: 2019-05-11
  Administered 2019-05-08 – 2019-05-09 (×2): 17 g via ORAL

## 2019-05-07 MED ORDER — POTASSIUM CHLORIDE 20 MEQ PO TBTQ
40-60 meq | ORAL | 0 refills | Status: DC | PRN
Start: 2019-05-07 — End: 2019-05-11

## 2019-05-07 MED ORDER — DEXTRAN 70-HYPROMELLOSE (PF) 0.1-0.3 % OP DPET
0 refills | Status: DC
Start: 2019-05-07 — End: 2019-05-07
  Administered 2019-05-07: 21:00:00 2 [drp] via OPHTHALMIC

## 2019-05-07 MED ORDER — FLUMAZENIL 0.1 MG/ML IV SOLN
.2 mg | INTRAVENOUS | 0 refills | Status: DC | PRN
Start: 2019-05-07 — End: 2019-05-12

## 2019-05-07 MED ORDER — PROPOFOL INJ 10 MG/ML IV VIAL
0 refills | Status: DC
Start: 2019-05-07 — End: 2019-05-07
  Administered 2019-05-07: 21:00:00 160 mg via INTRAVENOUS

## 2019-05-07 MED ORDER — DIPHENHYDRAMINE HCL 50 MG/ML IJ SOLN
25 mg | Freq: Once | INTRAVENOUS | 0 refills | Status: DC | PRN
Start: 2019-05-07 — End: 2019-05-07

## 2019-05-07 MED ORDER — ONDANSETRON HCL (PF) 4 MG/2 ML IJ SOLN
4 mg | Freq: Once | INTRAVENOUS | 0 refills | Status: DC | PRN
Start: 2019-05-07 — End: 2019-05-07

## 2019-05-07 MED ORDER — DIPHENHYDRAMINE HCL 50 MG/ML IJ SOLN
0 refills | Status: DC
Start: 2019-05-07 — End: 2019-05-07
  Administered 2019-05-07: 21:00:00 12.5 mg via INTRAVENOUS

## 2019-05-07 MED ORDER — HEPARIN, PORCINE (PF) 5,000 UNIT/0.5 ML IJ SYRG
5000 [IU] | SUBCUTANEOUS | 0 refills | Status: DC
Start: 2019-05-07 — End: 2019-05-11
  Administered 2019-05-08 – 2019-05-10 (×8): 5000 [IU] via SUBCUTANEOUS

## 2019-05-07 MED ORDER — SUCCINYLCHOLINE CHLORIDE 20 MG/ML IJ SOLN
INTRAVENOUS | 0 refills | Status: DC
Start: 2019-05-07 — End: 2019-05-07
  Administered 2019-05-07: 21:00:00 120 mg via INTRAVENOUS

## 2019-05-07 MED ORDER — SODIUM CHLORIDE 0.9 % IR SOLN
0 refills | Status: DC
Start: 2019-05-07 — End: 2019-05-07
  Administered 2019-05-07: 21:00:00 1000 mL

## 2019-05-07 MED ORDER — PHENYLEPHRINE IN 0.9% NACL(PF) 1 MG/10 ML (100 MCG/ML) IV SYRG
INTRAVENOUS | 0 refills | Status: DC
Start: 2019-05-07 — End: 2019-05-07
  Administered 2019-05-07 (×3): 100 ug via INTRAVENOUS

## 2019-05-07 MED ORDER — OXYCODONE 5 MG/5 ML PO SOLN
5-10 mg | ORAL | 0 refills | Status: DC | PRN
Start: 2019-05-07 — End: 2019-05-11
  Administered 2019-05-07 – 2019-05-10 (×9): 10 mg via ORAL

## 2019-05-07 MED ORDER — KETAMINE 10 MG/ML IJ SOLN
0 refills | Status: DC
Start: 2019-05-07 — End: 2019-05-07
  Administered 2019-05-07: 21:00:00 30 mg via INTRAVENOUS

## 2019-05-07 MED ORDER — FENTANYL CITRATE (PF) 50 MCG/ML IJ SOLN
25 ug | INTRAVENOUS | 0 refills | Status: DC | PRN
Start: 2019-05-07 — End: 2019-05-07
  Administered 2019-05-07 (×5): 25 ug via INTRAVENOUS

## 2019-05-07 MED ORDER — ACETAMINOPHEN 160 MG/5 ML PO SOLN
650 mg | ORAL | 0 refills | Status: DC | PRN
Start: 2019-05-07 — End: 2019-05-11
  Administered 2019-05-08 – 2019-05-10 (×6): 650 mg via ORAL

## 2019-05-07 MED ADMIN — LACTATED RINGERS IV SOLP [4318]: INTRAVENOUS | @ 23:00:00 | Stop: 2019-05-07 | NDC 00338011704

## 2019-05-07 MED ADMIN — FENTANYL CITRATE (PF) 50 MCG/ML IJ SOLN [3037]: 25 ug | INTRAVENOUS | @ 23:00:00 | Stop: 2019-05-07 | NDC 00409909412

## 2019-05-07 MED ADMIN — FENTANYL CITRATE (PF) 50 MCG/ML IJ SOLN [3037]: 25 ug | INTRAVENOUS | @ 22:00:00 | Stop: 2019-05-07 | NDC 00409909412

## 2019-05-07 NOTE — Progress Notes
Pt from OR with anesth, OR RN.   Attached to monitor.  pt c/o pain, fent 25 mcq iv given.  Orientated x 4, MAE.  HR is sinus rhythm, no ectopy noted.  Placed pt on simple mask 6L, quickly  titrating to RA.  Very productive cough, moderate cloudy sputum.    Left abd with J-tube, bag attached and placed for dependent drainage.  Voids, none accessed at this time.  Will cont to monitor closely.

## 2019-05-07 NOTE — H&P (View-Only)
Pre Procedure History and Physical/Sedation Plan      Procedure Date:  05/07/2019    Planned Procedure(s): Portacath placement    Indication for exam: Chemotherapy  ________________________________________________________________    Chief Complaint:   Esophageal cancer     Previous Anesthetic/Sedation History:  Reviewed    Allergies:  Cephalexin  Medications:  Scheduled Meds:Continuous Infusions:  PRN and Respiratory Meds:       Vital Signs:  Last Filed Vital Signs: 24 Hour Range   BP: 139/71 (08/07 0845)  Temp: 36.8 C (98.2 F) (08/07 0845)  Pulse: 71 (08/07 0845)  Respirations: 18 PER MINUTE (08/07 0845)  SpO2: 98 % (08/07 0845)  SpO2 Pulse: 71 (08/07 0845)  Height: 180.3 cm (71") (08/07 0711) BP: (112-144)/(70-78)   Temp:  [36.5 C (97.7 F)-37.2 C (99 F)]   Pulse:  [69-89]   Respirations:  [18 PER MINUTE-23 PER MINUTE]   SpO2:  [98 %-99 %]      Pre-procedure anxiolysis plan: Midazolam  Sedation/Medication Plan: Fentanyl, Lidocaine and Midazolam  Personal history of sedation complications: Denies adverse event.   Family history of sedation complications: Denies adverse event.   Medications for Reversal: Naloxone and Flumazenil  Discussion/Reviews:  Physician has discussed risks and alternatives of this type of sedation and above planned procedures with patient    NPO Status: Acceptable  Airway:  airway assessment performed  Mallampati II (soft palate, uvula, fauces visible)  Head and Neck: no abnormalities noted  Mouth: no abnormalities noted   Anesthesia Classification:  ASA III (A patient with a severe systemic disease that limits activity, but is not incapacitating)  Pregnancy Status: N/A    Lab/Radiology/Other Diagnostic Tests  Labs:  Relevant labs reviewed    I have examined the patient, and there are no significant changes in their condition, from the previous H&P performed on 05/06/19.    Cay Schillings, APRN-NP  Pager [phone]

## 2019-05-07 NOTE — Patient Instructions
Interventional Radiology  IMPLANTED PORT PLACEMENT  An implanted port is a small intravenous access device placed completely under the skin through which you may receive chemotherapy, other medications or blood products.??? It may also be used to draw blood.??? The port consists of a small reservoir that is implanted usually under the skin of your chest.??? This reservoir is connected to a catheter that is placed in a ???tunnel??? under the skin and ends in a large vein near the center of your chest.??? You may also hear it referred to as a ???chest port,??? ???power port??? or ???portacath.?????? A ???power port??? is a port that can be used for contrast injections for CT scans.??? A ???vortex port??????may be???used for photopheresis. In some cases, the port may be placed in the upper arm.  POST-PROCEDURE ACTIVITY:  ??? A responsible adult must drive you home.??? After receiving sedation or anesthesia, ???you should not drive, operate heavy machinery or do anything that requires concentration for at least 24 hours after receiving sedation.  ??? It is recommended that a responsible adult be with you until morning.  ??? Do not lift more than 5 lbs. and avoid any strenuous activity affecting the upper body such as pushing, pulling or straining for 10 days.  ??? If the port was placed in your arm, do not allow blood pressures to be taken in that arm.  POST-PROCEDURE SITE CARE:  ??? You will have a bandage over the incision on your chest and another small bandage at your neck.??? Keep the bandages dry.??? You may remove the small bandage at your neck in 24 hours and leave open to air.??? Remove the bandage over the incision on your chest after 48 hours.  ??? After 24 hours, you may shower. ???Keep the site covered and avoid direct water contact to the incision.???  ??? After 48 hours, remove dressing to shower. Use antibacterial soap, gently wash the site then pat dry after.  ??? There are no stitches to be removed.??? Steri-strips (strips of tape) may be used.?????? If present the strips will begin to fall off in 7-10 days.??? If they remain after 2 weeks, gently remove them.  ??? Do not submerge the area underwater for 1 week or until fully healed (no swimming/hot tub, etc.)  ??? Be sure your hands are clean when touching near the site.  ??? Do not use ointments, creams or powders on the incision.  ??? If you are admitted to the hospital, you will be taught to wash with Chlorhexidine (CHG) soap.??? This soap reduces germs on your skin and lowers your risk of infection while in the hospital.??? It will keep harmful germs off your skin for 24 hours, so it is important to use this soap daily.??? The nursing staff will teach you how to shower with this soap.??? If you are unable to shower, they will assist you with using it during a bed bath.??? It is not necessary to continue using this soap at home.  ??? This soap can cause dry skin.??? We recommend using lotion that is compatible with CHG after each bath or shower.??? The nursing staff can provide you with our recommended lotion in the hospital.  DIET/MEDICATIONS:  ??? You may resume your previous diet after the procedure.???  ??? If you receive sedation or anesthesia, avoid any foods or beverages containing alcohol for at least 24 hours after the procedure.???  ??? Please see the Medication Reconciliation sheet for instructions regarding resuming your home medications.  CALL THE DOCTOR IF:??????????????????????????????????????????????????????  ???  Bright red blood has soaked the bandage.???  ??? You have pain not relieved by medication.??? Some soreness at the site is to be expected.  ??? You have??? Chills, body aches, fever greater than 101?F  ??? Redness, swelling or warmth at or around the incision???  ??? Drainage or pus coming from the incision  ??? Increased tenderness around the incision  ??? You have opening of the edges of the incision.  ??? You have swelling of the face, neck, chest or arm on the side where the port was placed.????????????  ??? For severe problems such as excessive bleeding, chest pain or shortness of breath, please call 911.  ???  For any of the above symptoms or for problems or concerns related to the procedure,??? call??? [phone] for Monday-Friday 7-5.??? After-hours and weekends, please call??? [phone] and ask for the Interventional Radiology Resident on-call.

## 2019-05-07 NOTE — Other
Immediate Post Procedure Note    Date:  05/07/2019                                         Attending Physician:   Tina Griffiths  Performing Provider:  Rhunette Croft, MD    Consent:  Consent obtained from patient.  Time out performed: Consent obtained, correct patient verified, correct procedure verified, correct site verified, patient marked as necessary.  Pre/Post Procedure Diagnosis:  Esophageal cancer, needs chemo  Indications:  As above    Anesthesia: Local lidocaine with IV sedation fentanyl and versed  Procedure(s):  R IJ chest port placement  Findings:  Successful port placement     Estimated Blood Loss:  None/Negligible  Specimen(s) Removed/Disposition:  None  Complications: None  Patient Tolerated Procedure: Well  Post-Procedure Condition:  stable    Rhunette Croft, MD  Pager [phone]

## 2019-05-07 NOTE — Progress Notes
Dr Laurance Flatten notified CXR complete.

## 2019-05-07 NOTE — Other
Brief Operative Note    Name: Jason Garner is a 63 y.o. 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  nodularity throughout the esophagus      Estimated Blood Loss: No blood loss documented.     Specimen(s) Removed/Disposition:   ID Type Source Tests Collected by Time Destination   1 : Peritoneal Lavage for cyto-pathology Fluid Peritoneal Lavage CYTOLOGY FLUIDS Simonne Come, MD 05/07/2019 3500        Complications:  None    Implants: None    Drains: None 14 Fr. j tube    Disposition:  PACU - stable    Simonne Come, MD  Pager

## 2019-05-07 NOTE — Progress Notes
Pt ready for sign out. Dr Sherrill Raring at bedside. MD ok'd for transfer from recovery care.

## 2019-05-07 NOTE — Anesthesia Post-Procedure Evaluation
Post-Anesthesia Evaluation    Name: Jason Garner      MRN: [id number]     DOB: [birth year]     Age: 63 y.o.     Sex: male   __________________________________________________________________________     Procedure Information     Anesthesia Start Date/Time:  05/07/19 1537    Procedures:       DIAGNOSTIC LAPAROSCOPY OF ABDOMEN/ PERITONEUM/ OMENTUM WITH/ WITHOUT COLLECTION OF SPECIMENS BY BRUSHING/ WASHING LAPAROSCOPIC JEJUNOSTOMY (N/A )      LAPAROSCOPIC JEJUNOSTOMY (N/A )    Location:  [facility]    Provider:  Simonne Come, MD          Post-Anesthesia Vitals  BP: 151/74 (08/07 1745)  Pulse: 66 (08/07 1745)  Respirations: 14 PER MINUTE (08/07 1745)  SpO2: 94 % (08/07 1745)  SpO2 Pulse: 67 (08/07 1745)   Vitals Value Taken Time   BP 151/74 05/07/2019  5:45 PM   Temp 36.1 C (97 F) 05/07/2019  4:49 PM   Pulse 66 05/07/2019  5:45 PM   Respirations 14 PER MINUTE 05/07/2019  5:45 PM   SpO2 94 % 05/07/2019  5:45 PM         Post Anesthesia Evaluation Note    Evaluation location: Pre/Post  Patient participation: recovered; patient participated in evaluation  Level of consciousness: sleepy but conscious  Pain management: adequate    Hydration: normovolemia  Temperature: 36.0C - 38.4C  Airway patency: adequate    Perioperative Events       Post-op nausea and vomiting: no PONV    Postoperative Status  Cardiovascular status: hemodynamically stable  Respiratory status: spontaneous ventilation    Post-Anesthesia Evaluation Attestation: I reviewed and agree the indicated post-anesthesia care was provided. I have reviewed key portions of the indicated post anesthesia care. I have examined the patient's vitals, physical status, and complications and agree with what is documented.    Staff name:  Shanna Cisco, MD Date:  05/07/2019         Perioperative Events  Perioperative Event: No  Emergency Case Activation: No

## 2019-05-07 NOTE — H&P (View-Only)
Admission History and Physical Examination      Name:  Jason Garner                                             MRN:  [id number]   Admission Date:  05/07/2019                     Assessment/Plan:    Active Problems:    Abnormal weight loss    Dysphagia, unspecified    Malignant neoplasm of lower third of esophagus (HCC)    COPD (chronic obstructive pulmonary disease) (HCC)    Adenocarcinoma of right lung (HCC)    __________________________________________________________________________________  Primary Care Physician: Erskine Emery  Verified    Chief Complaint:  Esophageal adenocarcinoma     History of Present Illness: Jason Garner is a 63 y.o. male who was seen in the thoracic surgery clinic yesterday 05/07/19 with Dr. Derenda Fennel for evaluation of new diagnosis of lung cancer and esophageal adenocarcinoma. He presents this morning to [facility] for for staging lap, J tube placement with Dr. Derenda Fennel, and port placement in IR.   ???  Overall his symptoms have included intolerance to solids and weight loss of 50# over 3 months. Denies heartburn, voice changes such as hoarseness, vomiting, nausea, belching, gas, chest pressure, retrosternal/epigastric pain, or nocturnal GERD symptoms, coughing, wheezing.   ???  Work up summary:   History of Present Illness:??????Patient is a 63 year old male with a history of ???COPD, Barrett's esophagus,???melanoma and is a current smoker. Presented to his PCP in June with complaints of food getting stuck in his lower esophagus, tried to drink water to help it go down, would end up vomiting after that,???has had a 40 pound weight loss. CT done showing esophageal wall thickening. EGD biopsy coming back as adenocarcinoma.???Will follow with Dr. Donnajean Lopes for chemotherapy.???CT???scan also showed a 1.9 cm spiculated mass, biopsy revealed a well differentiated adenocarcinoma. Referral to Dr. Derenda Fennel for surgical evaluation.   ???  04/02/2019-EGD???with balloon dilation:??? 1. Gastroesophageal junction, biopsy:   - Adenocarcinoma  Per outside report, HER-2 immunostain shows equivocal result (score 2+).   HER-2 FISH result is positive (amplified).   PD L1 immunostain shows expressed PD L1 with 5% positivity.  ???  04/08/2019-CT C/A/P([facility]):   ???    04/20/2019-PET???([facility]):???Impression:  Focal area of uptake in the distal esophagus and along the known lesion is identified with an SUV max of 14.3. There is evidence of adenopathy in the intrahepatic ligament with an enlarged 2.1 x 2.3 cm lymph node with an SUV max of 10.3. there is uptake int eh subcarinal [artially calcified lymph node with an SUv max of 5.1. The nodule identified in the right upper lobe on recent CT scan does not demonstrate avidity for the radiopharmaceutical.???  ???  04/22/2019. ???IR guided right lung core biopsy revealed a well-differentiated adenocarcinoma. ???CK7 positive. ???CK20 negative. ???TTF-1 positive. ???AE1/AE3 positive. ???MART-1 negative. ???The tumor demonstrates in part a lipidic pattern and is primary to the lung.   ???  05/06/2019 PFT   FEV1-%Pred-Pre 51  ??? ??? %   ???  DLCOunc-%Pred-Pre 90  ??? ??? %     Per Dr. Derenda Fennel, plan is for staging laparosocpy and j-tube placement on today with PORT placement in IR prior.   ???  Dr. Bryson Dames has reviewed this patient's most recent  procedures and history and does feel that surgery is warranted. He will undergo a staging lap J tube for his esophageal adenocarcinoma.  We discussed surgical procedure and recovery, risks, benefits, alternatives. Jason Garner understands, accepts and wishes to proceed.??????    This procedure has been fully reviewed with the patient, and written informed consent has been obtained for staging lap J tube placement with Dr. Derenda Fennel.         Medical History:   Diagnosis Date   ??? Back pain    ??? Barrett's esophagus    ??? Cancer of esophagus (HCC)    ??? COPD (chronic obstructive pulmonary disease) (HCC) 05/05/2019   ??? Dysphagia ??? Esophageal cancer (HCC)    ??? Melanoma (HCC)    ??? Skin cancer     Excision forehead, right   ??? Weight loss, unintentional      Surgical History:   Procedure Laterality Date   ??? UPPER GASTROINTESTINAL ENDOSCOPY  04/02/2019   ??? COLONOSCOPY     ??? SKIN CANCER EXCISION     ??? TONSILLECTOMY       Family History   Problem Relation Age of Onset   ??? Stomach Cancer Father    ??? Stroke Mother    ??? Heart Attack Mother    ??? Brain Cancer Sister    ??? Heart Attack Sister    ??? Lung Disease Sister    ??? Mental Illness Sister    ??? Suicide Brother    ??? Other Brother         MVA     Social History     Socioeconomic History   ??? Marital status: Single     Spouse name: Not on file   ??? Number of children: Not on file   ??? Years of education: Not on file   ??? Highest education level: Not on file   Occupational History   ??? Not on file   Social Needs   ??? Financial resource strain: Not on file   ??? Food insecurity     Worry: Not on file     Inability: Not on file   ??? Transportation needs     Medical: Not on file     Non-medical: Not on file   Tobacco Use   ??? Smoking status: Current Every Day Smoker     Packs/day: 1.00     Years: 35.00     Pack years: 35.00     Types: Cigarettes   ??? Smokeless tobacco: Current User     Types: Chew   ??? Tobacco comment: 2-3 cigs a day   Substance and Sexual Activity   ??? Alcohol use: Not Currently     Comment: occasionally    ??? Drug use: Never   ??? Sexual activity: Not on file   Lifestyle   ??? Physical activity     Days per week: Not on file     Minutes per session: Not on file   ??? Stress: Not on file   Relationships   ??? Social Wellsite geologist on phone: Not on file     Gets together: Not on file     Attends religious service: Not on file     Active member of club or organization: Not on file     Attends meetings of clubs or organizations: Not on file     Relationship status: Not on file   ??? Intimate partner violence     Fear of current  or ex partner: Not on file     Emotionally abused: Not on file Physically abused: Not on file     Forced sexual activity: Not on file   Other Topics Concern   ??? Not on file   Social History Narrative   ??? Not on file      Vaping/E-liquid Use   ??? Vaping Use Never User          Immunizations (includes history and patient reported):   There is no immunization history on file for this patient.        Allergies:  Cephalexin    Medications:  Medications Prior to Admission   Medication Sig   ??? nicotine (NICODERM CQ STEP 1) 21 mg/day patch Apply 1 patch to top of skin as directed every 24 hours. Rotate patch location.   Indications: stop smoking   ??? omeprazole DR (PRILOSEC) 20 mg capsule Take 20 mg by mouth daily before breakfast.     Review of Systems:  Constitution: Negative.   HENT: Negative.    Eyes: Negative.    Cardiovascular: Positive for chest pain and dyspnea on exertion.   Respiratory: Negative.    Endocrine: Negative.    Hematologic/Lymphatic: Negative.    Skin: Negative.    Musculoskeletal: Negative.    Gastrointestinal: Positive for abdominal pain and vomiting.   Genitourinary: Negative.    Neurological: Negative.    Psychiatric/Behavioral: Negative.    Allergic/Immunologic: Negative.    ???    Physical Exam:  Vital Signs: Last Filed In 24 Hours Vital Signs: 24 Hour Range   BP: 144/76 (08/07 0715)  Temp: 36.5 ???C (97.7 ???F) (08/07 1610)  Pulse: 69 (08/07 0730)  Respirations: 21 PER MINUTE (08/07 0730)  SpO2: 99 % (08/07 0730)  SpO2 Pulse: 69 (08/07 0730)  Height: 180.3 cm (71) (08/07 0711) BP: (112-144)/(70-78)   Temp:  [36.5 ???C (97.7 ???F)-37.2 ???C (99 ???F)]   Pulse:  [69-89]   Respirations:  [21 PER MINUTE-23 PER MINUTE]   SpO2:  [98 %-99 %]           Physical Exam???  Constitutional: appears malnourished   HENT: No erythema of phayrnx, nares patent.  Head: Normocephalic.   Neck: Neck supple.   Cardiovascular: Normal rate, regular rhythm???and normal heart sounds. ???  Pulmonary/Chest: Effort normal???and breath sounds normal.   Musculoskeletal: Normal range of motion. Lymphadenopathy: no cervical adenopathy.   Neurological: alert???and oriented to person, place, and time.   Skin: Skin is warm???and dry.   Psychiatric: normal mood and affect.      Lab/Radiology/Other Diagnostic Tests:  24-hour labs:    Results for orders placed or performed during the hospital encounter of 05/07/19 (from the past 24 hour(s))   BLOOD TYPE CONFIRMATION - ORDER ONLY IF REQUESTED BY LAB    Collection Time: 05/07/19  7:15 AM   Result Value Ref Range    ABO/RH(D) O POS        Results for Jason Garner, Jason Garner (MRN [id number]) as of 05/07/2019 08:01   Ref. Range 05/05/2019 11:45 05/05/2019 14:07 05/06/2019 14:16 05/06/2019 14:16   Hemoglobin Latest Ref Range: 13.5 - 16.5 GM/DL 98.1      Hematocrit Latest Ref Range: 40 - 50 % 43.4      Platelet Count Latest Ref Range: 150 - 400 K/UL 274      White Blood Cells Latest Ref Range: 4.5 - 11.0 K/UL 8.0      RBC Latest Ref Range: 4.4 - 5.5 M/UL 4.64  MCV Latest Ref Range: 80 - 100 FL 93.5      MCH Latest Ref Range: 26 - 34 PG 32.2      MCHC Latest Ref Range: 32.0 - 36.0 G/DL 16.1      MPV Latest Ref Range: 7 - 11 FL 6.6 (L)      RDW Latest Ref Range: 11 - 15 % 13.4      INR Latest Ref Range: 0.8 - 1.2  1.0      APTT Latest Ref Range: 24.0 - 36.5 SEC 32.2      Sodium Latest Ref Range: 137 - 147 MMOL/L 140      Potassium Latest Ref Range: 3.5 - 5.1 MMOL/L 3.9      Chloride Latest Ref Range: 98 - 110 MMOL/L 104      CO2 Latest Ref Range: 21 - 30 MMOL/L 25      Anion Gap Latest Ref Range: 3 - 12  11      Blood Urea Nitrogen Latest Ref Range: 7 - 25 MG/DL 5 (L)      Creatinine Latest Ref Range: 0.4 - 1.24 MG/DL 0.96      eGFR Non African American Latest Ref Range: >60 mL/min >60      eGFR African American Latest Ref Range: >60 mL/min >60      Glucose Latest Ref Range: 70 - 100 MG/DL 045 (H)      Albumin Latest Ref Range: 3.5 - 5.0 G/DL 4.3      Calcium Latest Ref Range: 8.5 - 10.6 MG/DL 9.6      Total Bilirubin Latest Ref Range: 0.3 - 1.2 MG/DL 0.7 Total Protein Latest Ref Range: 6.0 - 8.0 G/DL 7.1      AST (SGOT) Latest Ref Range: 7 - 40 U/L 12      ALT (SGPT) Latest Ref Range: 7 - 56 U/L 9      Alk Phosphatase Latest Ref Range: 25 - 110 U/L 80      COVID-19 (SARS-CoV-2) PCR Latest Ref Range: DN-NOT DETECTED   NOT DETECTED     COVID-19 (SARS-CoV-2) PCR Source Unknown  NASOPHARYNGEAL SWAB     Record Check Unknown 2ND TYPE REQUIRED      ABO/RH(D) Unknown O POS      Antibody Screen Unknown NEG      Crossmatch Expires Unknown 05/08/2019,2359      Units Ordered Unknown 0      PFT COMPLETE PULM FUNCTION Unknown   Attch    FVC-Pre Latest Units: L   4.03    FVC-%Pred-pre Latest Units: %   87    FVC-Post Latest Units: L   4.27    FVC-%Pred-Post Latest Units: %   92    FEV1-Pre Latest Units: L   1.83    FEV1-Post Latest Units: L   2.55    FEV1-%Pred-Pre Latest Units: %   51    FEV1-%Pred-Post Latest Units: %   71    FEV1/FVC-Pre Latest Units: %   45    FEV1FVC-LLN Latest Units: %   66    FEV1SVC-Pre Latest Units: %   48    FEF2575-%Pred-Pre Latest Units: %   22    FEF2575-Post Latest Units: L/sec   1.53    PEF-Post Latest Units: L/min   293.6    RVPleth-Pre Latest Units: L   4.50    RVPleth-%Pred-Pre Latest Units: %   199    TLCPleth-Pre Latest Units: L   8.17    TLCPleth-%Pred-Pre  Latest Units: %   116    DLCOunc-Pre Latest Units: ml/min/mmHg   25.70    DLCOunc-%Pred-Pre Latest Units: %   90    DLVA-Pred Latest Units: ml/min/mmHg/L   4.22    DLVA-Pre Latest Units: ml/min/mmHg/L   4.38    DLVA-%Pred-Pre Latest Units: %   103    DLVA-SD Latest Units: ml/min/mmHg/L   0.73 0.220   DLVA-LLN Latest Units: ml/min/mmHg/L   2.76    DLVA-ULN Latest Units: ml/min/mmHg/L   5.68    FEF2575-Pre Latest Units: L/sec   0.67    DLCOunc-SD Latest Units: ml/min/mmHg   -0.425    FEF2575-%Pred-Post Latest Units: %   51      Pertinent radiology reviewed.    Jaleyah Longhi Crane-McCallister, APRN, FNP-C  [facility]

## 2019-05-07 NOTE — Progress Notes
Sedation physician present in room.  Recent vitals and patient condition reviewed between sedating physician and nurse.  Reassessment completed.  Determination made to proceed with planned sedation.

## 2019-05-08 LAB — CBC
Lab: 4.1 M/UL — ABNORMAL LOW (ref >60)
Lab: 8.4 K/UL — ABNORMAL LOW (ref >60)

## 2019-05-08 LAB — BASIC METABOLIC PANEL: Lab: 141 MMOL/L — ABNORMAL LOW (ref >60)

## 2019-05-08 MED ORDER — PANCRELIPASE-SODIUM BICARBONATE 20,880 K UNIT-650 MG
NASOGASTRIC | 0 refills | Status: DC | PRN
Start: 2019-05-08 — End: 2019-05-11

## 2019-05-08 NOTE — Progress Notes
No acute events  SR on tele. VSS, afebrile  Jtube to DD, minimal green output  Pain controlled on liq Oxy/Acetaminophen  Tolerating CLD without nausea/emesis  UOP adequate, but much bearing down, stream start/stop. Pt reports as baseline at home  Bowels hypoactive  No further questions

## 2019-05-08 NOTE — Progress Notes
Subjective:      No acute events overnight. Denies nausea or vomiting. Pain is well controlled.      Objective:     BP 118/68 (BP Source: Arm, Left Upper)  - Pulse 70  - Temp 36.6 C (97.8 F)  - Ht 180.3 cm (71")  - Wt 67.7 kg (149 lb 3.2 oz)  - SpO2 92%  - BMI 20.81 kg/m     General: No acute distress  Chest: symmetrical, non-labored breathing  Cardiac: regular rate and rhythm, normotensive.  Abdomen: soft, non-distended, appropriately tender along incision.   Extremities: no c/c/e      Assessment:     Jason Garner is a 63 y.o. male with esophageal cancer s/p staging diagnostic laparoscopy, J tube placement, and EGD 8/7    Plan:     Active Problems:    Abnormal weight loss    Dysphagia, unspecified    Malignant neoplasm of lower third of esophagus (HCC)    COPD (chronic obstructive pulmonary disease) (HCC)    Adenocarcinoma of right lung (HCC)    -pain control with oxy  -IS  -Start tube feed via J tube, advance as ordered. Bowel regimen  -FEN: replace per protocol, monitor for refeeding syndrome. MIVF to fall off this PM.   -CBC, BMP within normal rates.    -Ambulate TID  -Disp - Cont floor care.    Genia Plants, [place]

## 2019-05-08 NOTE — Operative Report(Direct Entry)
OPERATIVE REPORT    Name: Jason Garner is a 63 y.o. male     DOB: 1956-07-17             MRN#: [id number]    DATE OF OPERATION: 05/07/2019    Surgeon(s) and Role:     Derenda Fennel, Darrel Reach, MD - Primary     * Hansel Starling, MD - Fellow        Preoperative Diagnosis:    Malignant neoplasm of unspecified part of unspecified bronchus or lung (HCC) [C34.90]  Malignant neoplasm of esophagus, unspecified location (HCC) [C15.9]    Post-op Diagnosis      * Malignant neoplasm of unspecified part of unspecified bronchus or lung (HCC) [C34.90]     * Malignant neoplasm of esophagus, unspecified location (HCC) [C15.9]    Procedure(s):  DIAGNOSTIC LAPAROSCOPY OF ABDOMEN/ PERITONEUM/ OMENTUM WITH/ WITHOUT COLLECTION OF SPECIMENS BY BRUSHING/ WASHING LAPAROSCOPIC JEJUNOSTOMY  LAPAROSCOPIC JEJUNOSTOMY  Upper endoscopy with biopsy  Anesthesia Type: General anesthesia, biotics were administered prior to induction of anesthesia, SCDs as well as subcutaneous heparin was administered for DVT prophylaxis        Description and Findings of Operative Procedure: This is a 63 year old patient with locally advanced esophageal cancer of the GE junction.  Patient was brought to the operating room today for diagnostic laparoscopy as well as placement of a feeding jejunal tube.      We began the operation by performing an upper endoscopy.  The entire length of the esophagus was visualized.  The distal esophagus was notable for large tumor which could not be traversed with our endoscope.  There is significant nodularity of the entire esophagus to the level of the upper esophageal sphincter.  At approximately 22 cm from the incisors, I performed random biopsies of the esophagus to evaluate for potential Endo luminal spread of tumor in a submucosal plane.    With a few patient's abdomen prepped and draped in usual standard manner, a pneumoperitoneum was achieved using a Veress needle technique.  3 separate 5 mm visit ports were then placed.  We then visualize the entire abdominal compartment.  On inspection of the liver as well as the abdominal viscera I could not find evidence of tumor implants.  A peritoneal lavage using 200 mL of normal saline was performed and the specimen was submitted for cytopathology after dwell time of several minutes.  At this point a feeding jejunostomy tube was placed.  The proximal jejunum was identified approximately 20 cm from the ligament of Treitz.  This loop of bowel was anchored to the abdominal wall using Ethibond suture.  Once the loop of bowel was anchored to the abdominal wall using a Seldinger technique, a wire was introduced into the lumen of the bowel and using a peel-away sheath a 14 French feeding tube was inserted directly into the jejunum under direct visualization.  The feeding tube was anchored to the patient's skin.  He tolerated the procedure well.  Pneumoperitoneum was released and the ports were removed.  The skin was closed in Vicryl suture.  Estimated Blood Loss:  No blood loss documented.     Specimen(s) Removed/Disposition:   ID Type Source Tests Collected by Time Destination   1 : Peritoneal Lavage for cyto-pathology Fluid Peritoneal Lavage CYTOLOGY FLUIDS Bryson Dames, MD 05/07/2019 1605    2 : Esophageal biopsy for permanent  Tissue Esophagus SURGICAL PATHOLOGY          Bryson Dames, MD 05/07/2019 [phone]  Attestation: I performed this procedure with a resident.    Complications:  None    Implants: None    Drains: None    Disposition:  PACU - stable    Bryson Dames, MD  Pager

## 2019-05-08 NOTE — Consults
CLINICAL NUTRITION                                                        Clinical Nutrition Initial Assessment    Name: Jason Garner        MRN: [id number]          DOB: 08/26/1956          Age: 63 y.o.  Admission Date: 05/07/2019             LOS: 1 day        Recommendation:  ??? Would increase goal rate to 65mL/hr to better meet upper level of estimated calories and protein needs with hx of severe weight loss. This will also provide 100% of micronutrient needs.  ??? Could increase water boluses to q 4hrs to better meet baseline fluid requirements if he does not tolerate thin liquids at some point.  ??? If tolerating at goal rate x 24hrs, could consider transitioning to a more compressed regimen prior to discharge: 49mL/hr x 16hrs to provide 80% of nutrition needs IF patient is able to tolerate full liquids with Boost or oral nutrition supplements. Can further compress feeds as tolerated.    Comments:  63yo M with hx of COPD, Barrett's esophagus, melanoma, current tobacco use recently seen in thoracic surgery clinic for eval of new lung and esophageal adenocarcinoma per CT/EGD and biopsy. Had J-Tube placed in IR yesterday. Consult received for EN recs. Patient was accompanied by his wife today. EN Isosource 1.5 was started ~43mins prior to visit today, currently running at 58mL/hr. He states since May/June he has only been able to tolerate thin liquids or a thinner full liquid consistency via PO. His wife has placed pork, chicken, chili, etc in a blender with gravies or soups to help, which for the majority he has tolerated well. He can definitely tell a difference if it is too thick and will vomit (cannot tolerate lasagna). Also drinking nutrition supplement [employer], and is eating ice cream thinned out and pudding. He continues to have BMs. His weight in May was 200lbs per report; now 149lbs per standing weight, showing a 25% weight loss in 2-3 months. Limited weight hx in EMR. He has visible muscle and fat loss and meets criteria for acute/severe malnutrition. We discussed his JTube and need for EN. Reviewed possible course of compressed feeds if he tolerates and what this may look like for him once he returns home. Patient asked good questions. He denies actual swallowing or chewing difficulties, just lower esophageal obstructing per say. He is willing to drink Boost Breeze and Plus if his diet is advanced past clears.    Nutrition Assessment of Patient:  Admit Weight: 65.9 kg(standing scale 8/7); Usual Weight: 90.7 kg;    BMI (Calculated): 20.26; BMI Categories Adult: Acceptable: 18.5-24.9; Appearance: Thin  Pertinent Allergies/Intolerances: denied  Pertinent Labs: reviewe; normal K; Pertinent Meds: zofran, miralax, ppi; also has LR, NS and KCl on MAR but not needed today; Unintentional Weight Loss: > 7.5% in 3 months (severe)  Oral Diet Order: Clear liquid; Oral Supplement: Boost breeze(just ordered)  Current EN Order: Isosource 1.5 - 70mL/hr + free water q 4hrs for 2160 calories, 98g protein and free water  Current Oral Intake: Inadequate  Estimated Calorie Needs: 2031-2400 (30-35kcal/kg)  Estimated Protein Needs: 81-102 (1.2-1.5g/kg)    Malnutrition Assessment:   Malnutrition present; ICD-10 code E43: Acute illness/Severe malnutrition;  ; Energy intake: < 50% of estimated energy requirement for 5 days or more, Weight loss: > 7.5% x 3 months, Moderate loss of body fat, Moderate loss of muscle mass;  ;    Malnutrition Interventions: Encourage Boost supplements during diet advancements; Continue to monitor EN advancements and diet precription based on PO tolerance    Nutrition Focused Physical Assessment:   Loss of Subcutaneous Fat: Yes; Severity: Moderate; Location: Orbital, Triceps  Muscle Wasting: Yes; Severity: Moderate; Location: Temple, Clavicle  Edema: No;     Pressure Injury: none     Comment: +nausea around 9am    Nutrition Diagnosis:  Altered GI function Etiology: large bolus foods/solids not passing through esophagus  Signs & Symptoms: vomitting with solid foods  Increased nutrient needs, specify:(kcal/protein)  Etiology: newly dx lung/esophageal CA  Signs & Symptoms: need for J-Tube in conjuction with above Diagnosis and chemo treatment             Intervention / Plan:  EN recs  Monitor weights, labs, meds, GI health.  Discussed EN anticipate for home with patient and wife, including likely needing a pump with J-Tube feeds and possible transition to compressed feeds if ok'd and wishes to continue with thin/full liquid diet at home.    Goals:  EN tolerated  Time Frame: Within 48 hours                 Orlena Sheldon, RD, LD  [place] [phone]

## 2019-05-08 NOTE — Progress Notes
Patient is doing well.  We will start tube feeds today and advance to goal.  This may take several days to coordinate home tube feeds as well as reaching our nutritional goals.

## 2019-05-08 NOTE — Anesthesia Pain Rounding
Anesthesia Follow-Up Evaluation: Post-Procedure Day One    Name: Jason Garner     MRN: [id number]     DOB: 08-29-1956     Age: 63 y.o.     Sex: male   __________________________________________________________________________     Procedure Date: 05/07/2019   Procedure: Procedure(s):  DIAGNOSTIC LAPAROSCOPY OF ABDOMEN/ PERITONEUM/ OMENTUM WITH/ WITHOUT COLLECTION OF SPECIMENS BY BRUSHING/ WASHING LAPAROSCOPIC JEJUNOSTOMY  LAPAROSCOPIC JEJUNOSTOMY    Physical Assessment  Height: 180.3 cm (71)  Weight: 65.9 kg (145 lb 4.5 oz)    Vital Signs (Last Filed in 24 hours)  BP: 146/71 (08/07 1841)  Temp: 36.1 ???C (97 ???F) (08/07 1649)  Pulse: 68 (08/07 1841)  Respirations: 12 PER MINUTE (08/07 1815)  SpO2: 100 % (08/07 1841)  SpO2 Pulse: 70 (08/07 1841)  Height: 180.3 cm (71) (08/07 0711)    Patient History   Allergies  Allergies   Allergen Reactions   ??? Cephalexin RASH     Per patient has tolerated penicillins before        Medications  Scheduled Meds:heparin (porcine) PF syringe 5,000 Units, 5,000 Units, Subcutaneous, Q8H  nicotine (NICODERM CQ STEP 1) 21 mg/day patch 1 patch, 1 patch, Transdermal, Q24H*  pantoprazole (PROTONIX) injection 40 mg, 40 mg, Intravenous, BID(11-21)  polyethylene glycol 3350 (MIRALAX) packet 17 g, 1 packet, Oral, BID    Continuous Infusions:  ??? lactated ringers infusion 50 mL/hr at 05/07/19 1804   ??? sodium chloride 0.9 %   infusion 20 mL/hr at 05/07/19 0737     PRN and Respiratory Meds:acetaminophen Q6H PRN, ondansetron Q6H PRN **OR** ondansetron (ZOFRAN) IV Q6H PRN, oxyCODONE Q4H PRN, potassium chloride SR PRN **OR** potassium chloride PRN **OR** potassium chloride in water PRN (On Call from Rx)      Diagnostic Tests  Hematology:   Lab Results   Component Value Date    HGB 14.9 05/05/2019    HCT 43.4 05/05/2019    PLTCT 274 05/05/2019    WBC 8.0 05/05/2019    MCV 93.5 05/05/2019    MCH 32.2 05/05/2019    MCHC 34.4 05/05/2019    MPV 6.6 05/05/2019    RDW 13.4 05/05/2019         General Chemistry: Lab Results   Component Value Date    NA 140 05/05/2019    K 3.9 05/05/2019    CL 104 05/05/2019    CO2 25 05/05/2019    GAP 11 05/05/2019    BUN 5 05/05/2019    CR 0.77 05/05/2019    GLU 103 05/05/2019    CA 9.6 05/05/2019    ALBUMIN 4.3 05/05/2019    TOTBILI 0.7 05/05/2019      Coagulation:   Lab Results   Component Value Date    PTT 32.2 05/05/2019    INR 1.0 05/05/2019         Follow-Up Assessment  Patient location during evaluation: floor      Anesthetic Complications:   Anesthetic complications: The patient did not experience any anesthestic complications.      Pain:  Score: 3    Management:adequate     Level of Consciousness: awake and alert   Hydration:acceptable     Airway Patency: patent   Respiratory Status: acceptable, spontaneous ventilation and room air     Cardiovascular Status:acceptable and hemodynamically stable   Regional/Neuroaxial:

## 2019-05-09 ENCOUNTER — Encounter: Admit: 2019-05-09 | Discharge: 2019-05-09

## 2019-05-09 DIAGNOSIS — K2270 Barrett's esophagus without dysplasia: Secondary | ICD-10-CM

## 2019-05-09 DIAGNOSIS — R1310 Dysphagia, unspecified: Secondary | ICD-10-CM

## 2019-05-09 DIAGNOSIS — C4490 Unspecified malignant neoplasm of skin, unspecified: Secondary | ICD-10-CM

## 2019-05-09 DIAGNOSIS — J449 Chronic obstructive pulmonary disease, unspecified: Secondary | ICD-10-CM

## 2019-05-09 DIAGNOSIS — M549 Dorsalgia, unspecified: Secondary | ICD-10-CM

## 2019-05-09 DIAGNOSIS — R634 Abnormal weight loss: Secondary | ICD-10-CM

## 2019-05-09 DIAGNOSIS — C159 Malignant neoplasm of esophagus, unspecified: Secondary | ICD-10-CM

## 2019-05-09 DIAGNOSIS — C439 Malignant melanoma of skin, unspecified: Secondary | ICD-10-CM

## 2019-05-09 LAB — COMPREHENSIVE METABOLIC PANEL
Lab: 0.6 mg/dL (ref 0.4–1.24)
Lab: 106 MMOL/L (ref 98–110)
Lab: 111 mg/dL — ABNORMAL HIGH (ref 70–100)
Lab: 143 MMOL/L (ref 137–147)
Lab: 4.1 MMOL/L — ABNORMAL LOW (ref 3.5–5.1)
Lab: 8 mg/dL (ref 7–25)
Lab: 8.7 mg/dL (ref 8.5–10.6)

## 2019-05-09 LAB — CBC AND DIFF: Lab: 6.9 10*3/uL — AB (ref 4.5–11.0)

## 2019-05-09 LAB — MAGNESIUM: Lab: 2 mg/dL — ABNORMAL LOW (ref 1.6–2.6)

## 2019-05-09 NOTE — Progress Notes
Assumed patient care at 1900  VS stable, SR on tele   Pain managed with current pain med regimen   Tolerating RA  J tube in place, tube feeds infusing at goal. Tolerating well  Adequate urine output   -BM this shift.     0345; pt at 30 sec run of Vtach. Pt asymptomatic. S.Hemmings notified. No new orders at this time.,     No other concerns at this time, will continue to monitor

## 2019-05-09 NOTE — Progress Notes
VSS, SR on tele. Pain controlled with PRN oxy. UOP adequate, however pt continues to void in the toilet instead of saving to be measured. +BM this shift. TF started and pt tolerating well. Will monitor.

## 2019-05-09 NOTE — Progress Notes
Patient is doing well. His tube feeds are at 60 mils an hour. We will work on discharge for tomorrow.

## 2019-05-09 NOTE — Progress Notes
Subjective:      No acute events overnight. Denies nausea or vomiting. Pain is well controlled. Reports passing flatus.     Objective:     BP (!) 158/72  - Pulse 70  - Temp 36.7 C (98.1 F)  - Ht 180.3 cm (71")  - Wt 67.4 kg (148 lb 9.6 oz)  - SpO2 95%  - BMI 20.73 kg/m     General: No acute distress  Chest: symmetrical, non-labored breathing  Cardiac: regular rate and rhythm, normotensive.  Abdomen: soft, non-distended, appropriately tender along incision. Unchanged.  Extremities: no c/c/e      Assessment:     [name] is a 63 y.o. male with esophageal cancer s/p staging diagnostic laparoscopy, J tube placement, and EGD 8/7    Plan:     Active Problems:    Abnormal weight loss    Dysphagia, unspecified    Malignant neoplasm of lower third of esophagus (HCC)    COPD (chronic obstructive pulmonary disease) (HCC)    Adenocarcinoma of right lung (HCC)    Severe malnutrition (HCC)    -pain control with oxy  -IS  -Tolerating tube feed at goal. Bowel regimen  -FEN: replace per protocol, monitor for refeeding syndrome.    -CBC, BMP within normal rates.    -Ambulate TID  -Disp - Cont floor care. Will need to set up for home TF infusion, will work on [place].    Jason Garner, Jason Garner

## 2019-05-09 NOTE — Progress Notes
Date of Service: 05/05/2019      Subjective:             Reason for Visit:  Follow Up      Jason Garner is a 63 y.o. male         History of Present Illness    Obtained patient's verbal consent to treat them and their agreement to [facility] financial policy and NPP via this telehealth visit during the [facility]      Oncologic history is as summarized below.    Dysphagia - 1-2 months. Has to blend food. Has trouble with liquids. Lost 40 pounds x 2 months.     04/02/2019 EGD showed circumferential mass at the GE junction, causing near obstruction.  The scope could not be passed beyond.  It was dilated and the swelling improved partially.   Biopsy revealed an esophageal adenocarcinoma.  PD-L1 CPS score is equal to 5%.  HER-2/neu is 2+ by IHC.  FISH results pending.      CT chest abdomen pelvis on 04/08/2019.  Long segment circumferential distal esophageal wall thickening.  Pleural-parenchymal scarring with right within the right lung apex also measuring 1.9 cm spiculated suspicious mass.    04/20/2019.  PET scan No PET uptake in the right upper lobe lung nodule..  Along the distal third of the esophagus above the GE junction there is evidence of abnormal radiopharmaceutical uptake along with thickened portion of the esophagus.  SUV 14.3.  Large gastrohepatic lymph node 2.3 cm with SUV 10.3.   Calcified lymph nodes are consistent with previous granulomatous disease.  There is uptake in the subcarinal region adjacent to a calcified subcarinal lymph node SUV 5.1.    04/22/2019.  IR guided right lung core biopsy revealed a well-differentiated adenocarcinoma.  CK7 positive.  CK20 negative.  TTF-1 positive.  AE1/AE3 positive.  MART-1 negative.  The tumor demonstrates in part a lipidic pattern and is primary to the lung.     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  He is physically active.  He continues to report increase in dysphagia.  He is grinding up all his food to swallow.  He starting to have trouble with thick liquids as well.  He continues to lose weight. Patient is here today to discuss GITB review. He is going to set up Jtube and port via Dr. Seth Bake,       Review of Systems   Constitutional: Positive for unexpected weight change. Negative for appetite change, chills, fatigue and fever.   HENT: Positive for trouble swallowing. Negative for sore throat.    Respiratory: Negative for cough and shortness of breath.    Cardiovascular: Negative for chest pain and leg swelling.   Gastrointestinal: Negative for abdominal pain, blood in stool, constipation, diarrhea, nausea and vomiting.   Genitourinary: Negative for dysuria and hematuria.   Musculoskeletal: Negative for back pain.   Skin: Negative for rash.   Neurological: Negative for headaches.   Psychiatric/Behavioral: Negative for sleep disturbance.             Medical History:   Diagnosis Date   ??? Back pain    ??? Barrett's esophagus    ??? Cancer of esophagus (HCC)    ??? COPD (chronic obstructive pulmonary disease) (HCC) 05/05/2019   ??? Dysphagia    ??? Esophageal cancer (HCC)    ??? Melanoma (HCC)    ??? Skin cancer     Excision forehead,  right   ??? Weight loss, unintentional      Surgical History:   Procedure Laterality Date   ??? UPPER GASTROINTESTINAL ENDOSCOPY  04/02/2019   ??? DIAGNOSTIC LAPAROSCOPY OF ABDOMEN/ PERITONEUM/ OMENTUM WITH/ WITHOUT COLLECTION OF SPECIMENS BY BRUSHING/ WASHING LAPAROSCOPIC JEJUNOSTOMY N/A 05/07/2019    Performed by Bryson Dames, MD at [facility]   ??? LAPAROSCOPIC JEJUNOSTOMY N/A 05/07/2019    Performed by Bryson Dames, MD at [facility]   ??? COLONOSCOPY     ??? SKIN CANCER EXCISION     ??? TONSILLECTOMY       Family History   Problem Relation Age of Onset   ??? Stomach Cancer Father    ??? Stroke Mother    ??? Heart Attack Mother    ??? Brain Cancer Sister    ??? Heart Attack Sister    ??? Lung Disease Sister    ??? Mental Illness Sister    ??? Suicide Brother ??? Other Brother         MVA     Social History     Socioeconomic History   ??? Marital status: Single     Spouse name: Not on file   ??? Number of children: Not on file   ??? Years of education: Not on file   ??? Highest education level: Not on file   Occupational History   ??? Not on file   Tobacco Use   ??? Smoking status: Current Every Day Smoker     Packs/day: 1.00     Years: 35.00     Pack years: 35.00     Types: Cigarettes   ??? Smokeless tobacco: Current User     Types: Chew   ??? Tobacco comment: 2-3 cigs a day   Substance and Sexual Activity   ??? Alcohol use: Not Currently     Comment: occasionally    ??? Drug use: Never   ??? Sexual activity: Not on file   Other Topics Concern   ??? Not on file   Social History Narrative   ??? Not on file         Objective:         No current outpatient medications on file.     Vitals:    05/05/19 0742   PainSc: Zero     There is no height or weight on file to calculate BMI.     Pain Score: Zero         Pain Addressed:  Current regimen working to control pain.    Patient Evaluated for a Clinical Trial: Patient not eligible for a treatment trial (including not needing treatment, needs palliative care, in remission).     [place] Cooperative Oncology Group performance status is 1, Restricted in physically strenuous activity but ambulatory and able to carry out work of a light or sedentary nature, e.g., light house work, office work.     Physical Exam  Vitals signs and nursing note reviewed.   Constitutional:       Appearance: Normal appearance. He is normal weight.   HENT:      Head: Normocephalic and atraumatic.      Nose: Nose normal.   Eyes:      Extraocular Movements: Extraocular movements intact.   Neurological:      Mental Status: He is alert and oriented to person, place, and time.   Psychiatric:         Mood and Affect: Mood normal.         Behavior:  Behavior normal.         Thought Content: Thought content normal.         Judgment: Judgment normal.            CBC w/DIFF CBC with Diff Latest Ref Rng & Units 05/09/2019 05/08/2019 05/05/2019 12/10/2012   WBC 4.5 - 11.0 K/UL 6.9 8.4 8.0 6.5   RBC 4.4 - 5.5 M/UL 4.15(L) 4.17(L) 4.64 4.73   HGB 13.5 - 16.5 GM/DL 86.5 13.4(L) 14.9 14.8   HCT 40 - 50 % 39.3(L) 39.6(L) 43.4 45.3   MCV 80 - 100 FL 94.7 95.0 93.5 95.8   MCH 26 - 34 PG 33.0 32.1 32.2 31.4   MCHC 32.0 - 36.0 G/DL 78.4 69.6 29.5 28.4   RDW 11 - 15 % 13.2 13.2 13.4 14.3   PLT 150 - 400 K/UL 212 230 274 279   MPV 7 - 11 FL 7.2 7.0 6.6(L) 7.6   NEUT 41 - 77 % 74 - - -   ANC 1.8 - 7.0 K/UL 5.14 - - -   LYMA 24 - 44 % 18(L) - - -   ALYM 1.0 - 4.8 K/UL 1.23 - - -   MONA 4 - 12 % 6 - - -   AMONO 0 - 0.80 K/UL 0.43 - - -   EOSA 0 - 5 % 1 - - -   AEOS 0 - 0.45 K/UL 0.07 - - -   BASA 0 - 2 % 1 - - -   ABAS 0 - 0.20 K/UL 0.05 - - -     Comprehensive Metabolic Profile  CMP Latest Ref Rng & Units 05/09/2019 05/08/2019 05/05/2019 12/10/2012   NA 137 - 147 MMOL/L 143 141 140 140   K 3.5 - 5.1 MMOL/L 4.1 4.2 3.9 4.2   CL 98 - 110 MMOL/L 106 107 104 110   CO2 21 - 30 MMOL/L 31(H) 25 25 25    GAP 3 - 12 6 9 11  5(L)   BUN 7 - 25 MG/DL 8 8 5(L) 10   CR 0.4 - 1.24 MG/DL 1.32 4.40 1.02 7.25   GLUX 70 - 100 MG/DL 366(Y) 92 403(K) 742(V)   CA 8.5 - 10.6 MG/DL 8.7 8.8 9.6 9.2   TP 6.0 - 8.0 G/DL 9.5(G) - 7.1 6.9   ALB 3.5 - 5.0 G/DL 3.8(V) - 4.3 4.4   ALKP 25 - 110 U/L 74 - 80 67   ALT 7 - 56 U/L 6(L) - 9 22   TBILI 0.3 - 1.2 MG/DL 0.6 - 0.7 0.4   GFR >56 mL/min >60 >60 >60 >60   GFRAA >60 mL/min >60 >60 >60 >60       Tumor Markers  No results found for: CEA, CA199, CA125    Radiologic Examinations:  CHEST SINGLE VIEW  Narrative: CHEST SINGLE VIEW    Indication: s/p staging lap J tube.    Comparison: There are no prior studies for direct comparison.    Findings:    The heart and pulmonary vasculature are unremarkable. The lungs appear relatively well-aerated. No consolidative infiltrate, gross pleural effusion or pneumothorax is identified. A right IJ chest port is in place with the tip overlying the proximal right atrium. Mild pneumoperitoneum is evident.  Impression: No radiographic evidence of acute cardiopulmonary disease.    Pneumoperitoneum.     Finalized by Arlana Hove, M.D. on 05/08/2019 6:15 AM. Dictated by Arlana Hove, M.D. on 05/08/2019 6:14 AM.  Assessment and Plan:  This is 63 y.o. male with the following problems:       Stage III GEJ adenocarcinoma, HER2 positive  ??? History of Barrett's esophagus  ??? Dysphagia x 2 months. Lost 40 pounds x 2 months.   ??? 04/02/2019 EGD showed circumferential mass at the GE junction, causing near obstruction.  The scope could not be passed beyond.  It was dilated and the swelling improved partially.   ??? Biopsy revealed an esophageal adenocarcinoma.  PD-L1 CPS score is equal to 5%.  HER-2/neu is 2+ by IHC.  FISH results pending.  ??? CT chest abdomen pelvis on 04/08/2019.  Long segment circumferential distal esophageal wall thickening.  Pleural-parenchymal scarring with right within the right lung apex also measuring 1.9 cm spiculated suspicious mass.  ??? 04/20/2019.  PET scan No PET uptake in the right upper lobe lung nodule..  Along the distal third of the esophagus above the GE junction there is evidence of abnormal radiopharmaceutical uptake along with thickened portion of the esophagus.  SUV 14.3.  Large gastrohepatic lymph node 2.3 cm with SUV 10.3.  ???  Calcified lymph nodes are consistent with previous granulomatous disease.  There is uptake in the subcarinal region adjacent to a calcified subcarinal lymph node SUV 5.1.  PLAN  We reviewed the diagnosis, staging, disease biology, prognosis, treatment options and goal of treatment. We discussed that the patient has stage III cancer and that our goal of treatment would be curative intent with surgery but there is a risk of recurrence.  Reviewed at [facility]. HER-2 positive disease and hence plan for neoadjuvant FLOT + Herceptin + Perjeta x 2 months as his PS is excellent. Then evlaute for resectino of GEJ tumor and lung cancer. Followed by adjuvant FLOT +/- antiHER2 agents.  He will undergo port, J-tube and lap-staging by Dr. Seth Bake.  Patient will receive chemo via Dr. Donnajean Lopes close to home  Of note patient will not be regimen for clinical trials due to primary cancers.    Dysphagia. Loss of weight.  This is from cancer.  He has significant loss of weight of 40 pounds over the last 2 months.  Hence I recommended J-tube placement by Dr. Derenda Fennel.      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  04/22/2019.  IR guided right lung core biopsy revealed a well-differentiated adenocarcinoma.  CK7 positive.  CK20 negative.  TTF-1 positive.  AE1/AE3 positive.  MART-1 negative.  The tumor demonstrates in part a lepidic pattern and is primary to the lung.  Plan for surgically resecting at the time of Esophagectomy versus definitive radiation (if resection is not feasible).    History of Barrett's.  He is on PPI.                           The patient/family was/were allowed to ask questions and voice concerns; these were addressed to the best of our ability. Patient/family expressed understanding of what was explained and agreed with the present plan. Patient/family has the phone numbers for the Cancer Center and was instructed on how to contact us with any questions or concerns.     Vanita Ingles MD

## 2019-05-09 NOTE — Progress Notes
Assessments complete and documented per flowsheet.   A/Ox4. Tolerating RA. SR c PVCs on tele.  sBP consistently 150s, vital signs otherwise stable.   Incisional pain adequately managed with PRN tylenol + oxy.   Lap + IJ sites clean, dry, approximated, and open to air.  J-tube site clean, dry, and open to air; tolerating TF at goal; properly demonstrates hooking and unhooking TF and capping j-tube.   Ambulating with assist x1 for feeding pump pole, fall bundle in place.   UOA but not accurate with missed occurrence during BM attempt.  Last BM PTA.   Call light within reach, will continue to monitor until transferring care to night shift RN.

## 2019-05-10 ENCOUNTER — Encounter: Admit: 2019-05-07 | Discharge: 2019-05-08

## 2019-05-10 ENCOUNTER — Encounter: Admit: 2019-05-05 | Discharge: 2019-05-05

## 2019-05-10 ENCOUNTER — Encounter: Admit: 2019-05-07 | Discharge: 2019-05-10 | Disposition: A | Discharge status: Home Health

## 2019-05-10 ENCOUNTER — Encounter: Admit: 2019-05-07 | Discharge: 2019-05-07

## 2019-05-10 ENCOUNTER — Encounter: Admit: 2019-05-10 | Discharge: 2019-05-10

## 2019-05-10 DIAGNOSIS — K2270 Barrett's esophagus without dysplasia: Secondary | ICD-10-CM

## 2019-05-10 DIAGNOSIS — C3491 Malignant neoplasm of unspecified part of right bronchus or lung: Secondary | ICD-10-CM

## 2019-05-10 DIAGNOSIS — M47897 Other spondylosis, lumbosacral region: Secondary | ICD-10-CM

## 2019-05-10 DIAGNOSIS — Z800 Family history of malignant neoplasm of digestive organs: Secondary | ICD-10-CM

## 2019-05-10 DIAGNOSIS — E43 Unspecified severe protein-calorie malnutrition: Secondary | ICD-10-CM

## 2019-05-10 DIAGNOSIS — C160 Malignant neoplasm of cardia: Secondary | ICD-10-CM

## 2019-05-10 DIAGNOSIS — Z881 Allergy status to other antibiotic agents status: Secondary | ICD-10-CM

## 2019-05-10 DIAGNOSIS — Z836 Family history of other diseases of the respiratory system: Secondary | ICD-10-CM

## 2019-05-10 DIAGNOSIS — F17210 Nicotine dependence, cigarettes, uncomplicated: Secondary | ICD-10-CM

## 2019-05-10 DIAGNOSIS — Z818 Family history of other mental and behavioral disorders: Secondary | ICD-10-CM

## 2019-05-10 DIAGNOSIS — Z79899 Other long term (current) drug therapy: Secondary | ICD-10-CM

## 2019-05-10 DIAGNOSIS — Z823 Family history of stroke: Secondary | ICD-10-CM

## 2019-05-10 DIAGNOSIS — Z808 Family history of malignant neoplasm of other organs or systems: Secondary | ICD-10-CM

## 2019-05-10 DIAGNOSIS — Z8249 Family history of ischemic heart disease and other diseases of the circulatory system: Secondary | ICD-10-CM

## 2019-05-10 DIAGNOSIS — J449 Chronic obstructive pulmonary disease, unspecified: Secondary | ICD-10-CM

## 2019-05-10 DIAGNOSIS — Z85820 Personal history of malignant melanoma of skin: Secondary | ICD-10-CM

## 2019-05-10 MED ORDER — DOCUSATE SODIUM 50 MG/5 ML PO LIQD
100 mg | Freq: Two times a day (BID) | JEJUNOSTOMY | 0 refills | Status: DC
Start: 2019-05-10 — End: 2019-05-11
  Administered 2019-05-10: 13:00:00 100 mg via JEJUNOSTOMY

## 2019-05-10 MED ORDER — OXYCODONE 5 MG PO TAB
5 mg | ORAL_TABLET | ORAL | 0 refills | 6.00000 days | Status: AC | PRN
Start: 2019-05-10 — End: ?
  Filled 2019-05-10: qty 20, 4d supply, fill #1

## 2019-05-10 MED ORDER — POLYETHYLENE GLYCOL 3350 17 GRAM PO PWPK
17 g | Freq: Two times a day (BID) | JEJUNOSTOMY | 0 refills | 22.00000 days | Status: AC
Start: 2019-05-10 — End: ?

## 2019-05-10 MED ORDER — ACETAMINOPHEN 160 MG/5 ML PO SOLN
650 mg | ORAL | 0 refills | Status: AC | PRN
Start: 2019-05-10 — End: ?

## 2019-05-10 MED ORDER — SENNOSIDES 8.8 MG/5 ML PO SYRP
17.6 mg | Freq: Two times a day (BID) | JEJUNOSTOMY | 0 refills | Status: AC
Start: 2019-05-10 — End: ?

## 2019-05-10 MED ORDER — HEPARIN, PORCINE (PF) 100 UNIT/ML IV SYRG
500 [IU] | Freq: Once | 0 refills | Status: CP
Start: 2019-05-10 — End: ?

## 2019-05-10 MED ORDER — MAGNESIUM HYDROXIDE 2,400 MG/10 ML PO SUSP
10 mL | Freq: Two times a day (BID) | JEJUNOSTOMY | 0 refills | Status: DC
Start: 2019-05-10 — End: 2019-05-11
  Administered 2019-05-10: 16:00:00 10 mL via JEJUNOSTOMY

## 2019-05-10 MED ORDER — SENNOSIDES 8.8 MG/5 ML PO SYRP
17.6 mg | Freq: Two times a day (BID) | JEJUNOSTOMY | 0 refills | Status: DC
Start: 2019-05-10 — End: 2019-05-11
  Administered 2019-05-10: 13:00:00 17.6 mg via JEJUNOSTOMY

## 2019-05-10 NOTE — Discharge Instructions - Pharmacy
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 Back pain    ??? Barrett's esophagus    ??? Cancer of esophagus (HCC)    ??? COPD (chronic obstructive pulmonary disease) (HCC) 05/05/2019   ??? Dysphagia    ??? Esophageal cancer (HCC)    ??? Melanoma (HCC)    ??? Skin cancer     Excision forehead, right   ??? 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 Collection Time: 05/07/19  7:15 AM   Result Value Ref Range   ??? ABO/RH(D) O POS ???   ???    Results for Jason Garner, Jason Garner (MRN [id number]) as of 05/07/2019 08:01  ??? Ref. Range 05/05/2019 11:45 05/05/2019 14:07 05/06/2019 14:16 05/06/2019 14:16   Hemoglobin Latest Ref Range: 13.5 - 16.5 GM/DL 19.1 ??? ??? ???   Hematocrit Latest Ref Range: 40 - 50 % 43.4 ??? ??? ???   Platelet Count Latest Ref Range: 150 - 400 K/UL 274 ??? ??? ???   White Blood Cells Latest Ref Range: 4.5 - 11.0 K/UL 8.0 ??? ??? ???   RBC Latest Ref Range: 4.4 - 5.5 M/UL 4.64 ??? ??? ???   MCV Latest Ref Range: 80 - 100 FL 93.5 ??? ??? ???   MCH Latest Ref Range: 26 - 34 PG 32.2 ??? ??? ???   MCHC Latest Ref Range: 32.0 - 36.0 G/DL 47.8 ??? ??? ???   MPV Latest Ref Range: 7 - 11 FL 6.6 (L) ??? ??? ???   RDW Latest Ref Range: 11 - 15 % 13.4 ??? ??? ???   INR Latest Ref Range: 0.8 - 1.2  1.0 ??? ??? ??? APTT Latest Ref Range: 24.0 - 36.5 SEC 32.2 ??? ??? ???   Sodium Latest Ref Range: 137 - 147 MMOL/L 140 ??? ??? ???   Potassium Latest Ref Range: 3.5 - 5.1 MMOL/L 3.9 ??? ??? ???   Chloride Latest Ref Range: 98 - 110 MMOL/L 104 ??? ??? ???   CO2 Latest Ref Range: 21 - 30 MMOL/L 25 ??? ??? ???   Anion Gap Latest Ref Range: 3 - 12  11 ??? ??? ???   Blood Urea Nitrogen Latest Ref Range: 7 - 25 MG/DL 5 (L) ??? ??? ???   Creatinine  Latest Ref Range: 0.4 - 1.24 MG/DL 3.66 ??? ??? ???   eGFR Non African American Latest Ref Range: >60 mL/min >60 ??? ??? ???   eGFR African American Latest Ref Range: >60 mL/min >60 ??? ??? ???   Glucose Latest Ref Range: 70 - 100 MG/DL 440 (H) ??? ??? ???   Albumin Latest Ref Range: 3.5 - 5.0 G/DL 4.3 ??? ??? ???   Calcium Latest Ref Range: 8.5 - 10.6 MG/DL 9.6 ??? ??? ???   Total Bilirubin Latest Ref Range: 0.3 - 1.2 MG/DL 0.7 ??? ??? ???   Total Protein Latest Ref Range: 6.0 - 8.0 G/DL 7.1 ??? ??? ???   AST (SGOT) Latest Ref Range: 7 - 40 U/L 12 ??? ??? ???   ALT (SGPT) Latest Ref Range: 7 - 56 U/L 9 ??? ??? ???   Alk Phosphatase Latest Ref Range: 25 - 110 U/L 80 ??? ??? ???   COVID-19 (SARS-CoV-2) PCR Latest Ref Range: DN-NOT DETECTED  ??? NOT DETECTED ??? ???   COVID-19 (SARS-CoV-2) PCR Source Unknown ??? NASOPHARYNGEAL SWAB ??? ???   Record Check Unknown 2ND TYPE REQUIRED ??? ??? ???   ABO/RH(D) Unknown O POS ??? ??? ???   Antibody Screen Unknown NEG ??? ??? ???   Crossmatch Expires Unknown 05/08/2019,2359 ??? ??? ???   Units Ordered Unknown 0 ??? ??? ???   PFT COMPLETE PULM FUNCTION Unknown ??? ??? Attch ???   FVC-Pre Latest Units: L ??? ??? 4.03 ???   FVC-%Pred-pre Latest Units: % ??? ??? 87 ???   FVC-Post Latest Units: L ??? ??? 4.27 ???   FVC-%Pred-Post Latest Units: % ??? ??? 92 ???   FEV1-Pre Latest Units: L ??? ??? 1.83 ???   FEV1-Post Latest Units: L ??? ??? 2.55 ???   FEV1-%Pred-Pre Latest Units: % ??? ??? 51 ???   FEV1-%Pred-Post Latest Units: % ??? ??? 71 ???   FEV1/FVC-Pre Latest Units: % ??? ??? 45 ???   FEV1FVC-LLN Latest Units: % ??? ??? 66 ???   FEV1SVC-Pre Latest Units: % ??? ??? 48 ???   FEF2575-%Pred-Pre Latest Units: % ??? ??? 22 ???   FEF2575-Post Latest Units: L/sec ??? ??? 1.53 ??? PEF-Post Latest Units: L/min ??? ??? 293.6 ???   RVPleth-Pre Latest Units: L ??? ??? 4.50 ???   RVPleth-%Pred-Pre Latest Units: % ??? ??? 199 ???   TLCPleth-Pre Latest Units: L ??? ??? 8.17 ???   TLCPleth-%Pred-Pre Latest Units: % ??? ??? 116 ???   DLCOunc-Pre Latest Units: ml/min/mmHg ??? ??? 25.70 ???   DLCOunc-%Pred-Pre Latest Units: % ??? ??? 90 ???   DLVA-Pred Latest Units: ml/min/mmHg/L ??? ??? 4.22 ???   DLVA-Pre Latest Units: ml/min/mmHg/L ??? ??? 4.38 ???   DLVA-%Pred-Pre Latest Units: % ??? ??? 103 ???   DLVA-SD Latest Units: ml/min/mmHg/L ??? ??? 0.73 0.220   DLVA-LLN Latest Units: ml/min/mmHg/L ??? ??? 2.76 ???   DLVA-ULN Latest Units: ml/min/mmHg/L ??? ??? 5.68 ???   FEF2575-Pre Latest Units: L/sec ??? ??? 0.67 ???   DLCOunc-SD Latest Units: ml/min/mmHg ??? ??? -0.425 ???   FEF2575-%Pred-Post Latest Units: % ??? ??? 51 ???   ???  Pertinent radiology reviewed.  ???      Brief Hospital Course:  Jason Garner was admitted to [facility] on 05/07/19 for elective staging lap, Jejunostomy tube placement with Dr. Derenda Fennel, and port placement in IR.???The patient was placed postoperatively on [facility] unit and remained there for this hospitalization. He has had an uneventful recovery. Post op day # 1 J  tube feeds were started. He was started on a clear liquid diet.  He increased his activity to baseline. He had normal bowel and bladder function, and was stable to be discharged home on POD# 3.  Surgical pathology, diet, and J tube feeds are pending at discharge.     Condition at Discharge: Stable     Discharge Diagnoses:    Hospital Problems        Active Problems    Abnormal weight loss    Dysphagia, unspecified    Malignant neoplasm of lower third of esophagus (HCC)    COPD (chronic obstructive pulmonary disease) (HCC)    Adenocarcinoma of right lung (HCC)    Severe malnutrition (HCC)        Malnutrition Details:  ICD-10 code E43: Acute illness/Severe malnutrition    Energy intake: < 50% of estimated energy requirement for 5 days or more, Weight loss: > 7.5% x 3 months, Moderate loss of body fat, Moderate loss of muscle mass      Loss of Subcutaneous Fat: Yes Moderate Orbital, Triceps  Muscle Wasting: Yes Moderate Temple, Clavicle  Edema: No      Malnutrition Interventions: Encourage Boost supplements during diet advancements; Continue to monitor EN advancements and diet precription based on PO tolerance    Surgical Procedures:   DIAGNOSTIC LAPAROSCOPY OF ABDOMEN/ PERITONEUM/ OMENTUM WITH/ WITHOUT COLLECTION OF SPECIMENS BY BRUSHING/ WASHING  LAPAROSCOPIC JEJUNOSTOMY    R IJ chest port placement    Significant Diagnostic Studies and Procedures: noted in brief hospital course    Consults:  Anesthesiology    Patient Disposition: Home       Patient instructions/medications:      Chest 2 Views   Standing Status: Future Standing Exp. Date: 05/09/20        2 weeks-approximate     Reason for exam:(Sign,Symptom,Reason) esophageal cancer      Tube Feeding    Formula: Isosource 1.5  Schedule: Continuous rate of 60 milliliters per hour; with water bolus every 4 hours.     Home Care Instructions:  *flush/rinse tube with water after feed to prevent clogging.  *Don't open formula for more than 24 hours.  *Stop feeding if you are nauseated or vomiting and hold feeding for 1 hour.  *Sit up during and after feeding.  *Feedings given to fast can cause cramping and loose stools.     Esophagectomy Clear Liquid Diet    Your diet should consist of clear, transparent, non-carbonated liquid foods, and no jello.    If you have any questions regarding your diet at home, you may contact a dietitian at [phone].     General Supplement    Supplement: boost breeze   Amount: on carton as needed  How often: as tolerated     Other Activity Restrictions    -You should and need to walk daily. Your goal is to walk 30 minutes at at time without stopping for breaks. This is a daily exercise routine that you should start as soon as you arrive home. Your basic daily activities do not count toward your 30 minute minimum, e.g. housework, toileting, fixing meals. Do not exercise outside in extremely hot or cold temperatures.  -Bathing: NO tub baths, hot tubs, or swimming for 6 weeks. You may shower at any time.  -Driving: NO driving for 2 weeks or while taking narcotics  -Lifting: NO lifting more than 10 pounds (gallon of milk) for 6 weeks.     Incision Care  Keep your incision(s) clean and dry.  It is ok to shower unless otherwise noted.  Do not soak in a bath tub, hot tub, pool or lake for 6 weeks.  Do not use creams or lotions on incision until it is fully healed.  If your surgeon used Dermabond (skin glue), this will fall off gradually, do not pick at the glue.  If you notice redness, swelling, drainage, foul odor, or sutures sticking up through your incision, please call the Cardiothoracic Surgery clinic immediately.  [phone]     Report These Signs and Symptoms    * Fever greater than 101 degrees   * Excessive redness of your incisions   * Colored drainage (yellow or green), bloody drainage, or odorous drainage from your incisions. It is normal to have a small amount of clear, pink, or Benefiel-tinged drainage. If you have any concerns, call the surgeon's office.   * Uncontrolled pain   * Increased difficulty swallowing, uncontrolled nausea, vomiting, or diarrhea   * Vomiting blood or bloody stools   * Chest pain or back pain that is new and not controlled with pain medication   * Increased difficulty breathing     Opioid (Narcotic) Safety Information    OPIOID (NARCOTIC) PAIN MEDICATION SAFETY    We care about your comfort, and believe you need opioid medications at this time to treat your pain.  An opioid is a strong pain medication.  It is only available by prescription for moderate to severe pain.  Usually these medications are used for only a short time to treat pain, but sometimes will be prescribed for longer.  Talk with your doctor or nurse about how long they expect you to need this medication. When used the right way, opioids are safe and effective medications to treat your pain, even when used for a long time.  Yet, when used in the wrong way, opioids can be dangerous for you or others.  Opioids do not work for everyone.  Most patients do not get full relief of their pain from opioid medication; full relief of your pain may not be possible.     For your safety, we ask you to follow these instructions:    *Only take your opioid medication as prescribed.  If your pain is not controlled with the prescribed dose, or the medication is not lasting long enough, call your doctor.  *Do not break or crush your opioid medication unless your doctor or pharmacist says you can.  With certain medications, this can be dangerous, and may cause death.  *Never share your medications with others, even if they appear to have a good reason.  Never take someone else's pain medication-this is dangerous, and illegal (a crime).  Overdoses and deaths have occurred.  *Keep your opioid medications safe, as you would with cash, in a lock box or similar container.  *Make sure your opioids are going to be secure, especially if you are around children or teens.  *Talk with your doctor or pharmacist before you take other medications.  *Avoid driving, operating machinery, or drinking alcohol while taking opioid pain medication.  This may be unsafe.    Pain medications can cause constipation. Constipation is bowel movements that are less often than normal. Stools often become very hard and difficult to pass. This may lead to stomach pain and bloating. It may also cause pain when trying to use the bathroom. Constipation may be treated with suppositories, laxatives or stool softeners. A diet high in  fiber with plenty of fluids helps to maintain regular, soft bowel movements.     Questions About Your Stay    For questions or concerns regarding your hospital stay. Call [phone] Discharging attending physician: Williams Che [[id number]]      Return Appointment    You will have a chest xray at 2:15 pm with an appointment to follow at 3:15 pm.     Where do I go for my x-ray?  Enter the main hospital entrance  Go past the information desk, past the escalators, to the six-pack of elevators.  (If you reach the cafeteria you have gone too far!)  Take the elevator to the 2nd floor.  Follow the sign for General Radiology - you will go down a short hallway.  Once your x-ray is complete, return to the main floor and check-in for your appointment at the Cardiovascular Medicine and Cardiovascular and Thoracic Surgery office located by the main entrance.     [place] Provider Bryson Dames [[id number]]    Location [facility]    Appointment date: 06/03/2019    Appointment time: 2:15 PM       Current Discharge Medication List       START taking these medications    Details   acetaminophen (TYLENOL) 160 mg/5 mL oral solution Take 20.3 mL by mouth every 6 hours as needed.  Qty: 240 mL    PRESCRIPTION TYPE:  OTC      oxyCODONE (ROXICODONE) 5 mg tablet Take one tablet by mouth every 4 hours as needed for Pain  Take with food.  Qty: 20 tablet, Refills: 0    PRESCRIPTION TYPE:  Normal      polyethylene glycol 3350 (MIRALAX) 17 g packet one packet by Per J Tube route twice daily.  Qty: 12 each    PRESCRIPTION TYPE:  OTC      senna (SENOKOT) 8.8 mg/5 mL oral syrup 10 mL by Per J Tube route twice daily.    PRESCRIPTION TYPE:  OTC          CONTINUE these medications which have NOT CHANGED    Details   nicotine (NICODERM CQ STEP 1) 21 mg/day patch Apply 1 patch to top of skin as directed every 24 hours. Rotate patch location.   Indications: stop smoking    PRESCRIPTION TYPE:  Historical Med      omeprazole DR (PRILOSEC) 20 mg capsule Take 20 mg by mouth daily before breakfast.    PRESCRIPTION TYPE:  Historical Med              Scheduled appointments:    Jun 03, 2019  3:15 PM CDT Post - Op with Bryson Dames, MD  [facility]) [address]  [place] [address]  [place] [address]  [phone]          Pending items needing follow up:   J tube feeds  Diet  Pathology    Signed:  Earney Hamburg Crane-McCallister, APRN-NP  05/10/2019      cc:  Primary Care Physician:  Erskine Emery     Referring physicians:  Dpt Colbert, [facility] [place] [facility]*   Additional provider(s):

## 2019-05-10 NOTE — Progress Notes
Chaplain Note: Chaplain encountered patient on rounds. Patient said they were doing "fine" and hopefully going home later today. No further spiritual care was needed at this time.     Admit Date: 05/07/2019         The spiritual care team is available as needed, 24/7, through the campus switchboard [phone]).  For immediate response, please page [phone].  For a response within 24 hours, please submit an order in O2 for a chaplain consult.         Date/Time:                      User:                                    Pager: [phone]  05/10/2019 10:15 AM Kavin Leech      PCU 1

## 2019-05-10 NOTE — Progress Notes
CARDIOTHORACIC SURGERY DAILY PROGRESS NOTE    PROCEDURE:   DIAGNOSTIC LAPAROSCOPY OF ABDOMEN/ PERITONEUM/ OMENTUM WITH/ WITHOUT COLLECTION OF SPECIMENS BY BRUSHING/ WASHING  LAPAROSCOPIC JEJUNOSTOMY    POD #: 3    OVERNIGHT EVENTS:  None    ASSESSMENT:    Patient Active Problem List    Diagnosis Date Noted   ??? Severe malnutrition (HCC) 05/08/2019     Class: Acute   ??? Abnormal weight loss 05/05/2019   ??? Dysphagia, unspecified 05/05/2019   ??? Other spondylosis, lumbosacral region 05/05/2019   ??? Malignant neoplasm of lower third of esophagus (HCC) 05/05/2019   ??? COPD (chronic obstructive pulmonary disease) (HCC) 05/05/2019   ??? Adenocarcinoma of right lung (HCC) 05/05/2019        PLAN:  1.  Neuro/Pain- Pt is awake, oriented x 3. Continue acetaminophen solution and oxycodone for pain through J tube.   2. CV - Rhythm sinus HR- 60-70. SBP-150's.  CV PTA meds: none.    3. Resp - Sats-  95-96% on room air. Cont  aggressive pulm toilet.  4. Renal - Cr- 0.69. UOP- 1 L/24 hours. Assess for AKI., avoid nephrotoxic medications.   5. GI -  Abd soft-flat/ non tender, clear liquid diet. Last BM PTA - will provide more bowel medications today.   6. ID - WBC- 6.9, Afebrile.  7. Endo - No history of Diabetes or Thyroid disease.   8. Activity - Get pt up to chair and walk in hall TID  9. Heme -  13.7 Hgb/ 39.3 Hct/ Plt 212, OR estimated blood loss none - monitor for acute blood loss anemia. Heparin SQ TID for DVT ppx.  10. Lytes -  K-4.1. Na - 143.    Replace all electrolytes per protocol.     Disposition -    D/c home once tube feed teach is completed and has BM.         OBJECTIVE:  Vitals:    05/09/19 1526 05/09/19 1932 05/09/19 2245 05/10/19 0437   BP:  (!) 146/73 (!) 154/72 (!) 155/69   BP Source:  Arm, Left Upper     Pulse: 65 69     Temp:  36.8 ???C (98.2 ???F) 36.7 ???C (98 ???F) 36.7 ???C (98 ???F)   SpO2:  96% 95% 96%   Weight:       Height:            Physical Exam:  General: A&O x 3  Cardiovascular: RRR no rub or murmur Respiratory: LS CTA bil  GI: soft, NT, +BS, J tube infusing tube feeds.   Extremities: No Edema  Incisions: lap sites with derma bond, open to air.      Prophylaxis Review:  Lines:  No  Antibiotic Usage:  No  VTE:  Pharmacological prophylaxis; SQ Heparin and Mechanical prophylaxis; Sequential compression device  Urinary Catheter: No      PFT's Summary:     Lab Results   Component Value Date    FVCPRE 4.03 05/06/2019    FVCPREDPRE 87 05/06/2019    FEV1PRE 1.83 05/06/2019    FEV1PREDPRE 51 05/06/2019           LABS:   24-hour labs:  No results found for this visit on 05/07/19 (from the past 24 hour(s)).    Jason Garner A Crane-McCallister, APRN-NP  [employer]

## 2019-05-10 NOTE — Progress Notes
Assumed patient care at 1900  VS stable, SR on tele  Tolerating RA  Pain managed with current pain med regimen   J tube in place.   Tube feeds infusing at goal, tolerating well.   Adequate urine output  -BM this shift.     No other concerns at this time, will continue to monitor

## 2019-05-10 NOTE — Progress Notes
1730 - Reviewed discharge instructions, prescriptions/medications, follow-up appt with pt.  Pt states verbal understanding - no questions.  IV and Tele dc'd.  Port-a-cath deaccessed - pt tolerated procedure well, no bleeding noted - 2x2 and tegaderm applied - pt instructed to remove in 24 hours.  Pt states verbal understanding of J-tube care and maintenance - no questions at this time - supplies will be delivered to pt's home this evening.  Pt will pick up medications at [facility] on his way to the front door.

## 2019-05-24 ENCOUNTER — Encounter: Admit: 2019-05-24 | Discharge: 2019-05-25

## 2019-05-24 ENCOUNTER — Encounter: Admit: 2019-05-24 | Discharge: 2019-05-24

## 2019-05-24 ENCOUNTER — Encounter: Admit: 2019-05-25 | Discharge: 2019-05-25

## 2019-05-24 DIAGNOSIS — R69 Illness, unspecified: Secondary | ICD-10-CM

## 2019-05-25 ENCOUNTER — Encounter: Admit: 2019-05-25 | Discharge: 2019-05-25

## 2019-05-25 NOTE — Telephone Encounter
Clinical Nutrition Telephone Call Summary    Jason Garner is a 63 y.o. male with hx of COPD, Barrett's esophagus, melanoma, current tobacco use recently seen in thoracic surgery clinic for eval of new lung and esophageal adenocarcinoma per CT/EGD and biopsy. Pt had J-Tube placed 8/7 for nutrition needs and weight loss. He has been NPO since. RD contacted by RN regarding 9/10 abd pain and increased gas.     Discussed with pt and his wife over the phone. They report improvement in pain since he paused his feeds today. He notes he was doing"okay" on continuous feeds of Isosource 1.5 at 5ml/hr x 24hr for the past few weeks, though his stomach always felt a little uncomfortable. Pain worsened in the past 1-2 days. He is hesitant to switch formulas since his formula is not covered by insurance and Isosource 1.5 is most affordable. We discussed trying to transition to nocturnal feeds and pt would like to try this.     Plan is for Isosource 1.5 at 80 ml/hr x 16 hours per night (5 cartons total). Flush 60 ml water before and after to clean the tube and 171ml Q4hr throughout the whole day. Provides 1875 kcal, 85 g protein and 955 ml free water. Emailed pt written instructions and my contact info. If he does not tolerate this, next step would be to try a fiber-free formula.    Nutrition Assessment of Patient:  BMI Categories Adult: Acceptable: 18.5-24.9  Estimated Calorie Needs: 1900-2040(28-30 kcal/kg present wt 68kg)  Estimated Protein Needs: 80-95 g(1.2-1.4 g/kg present wt 68kg)    Paul Half, MS, RD, LD   Office Phone [phone]  Available on [employer]

## 2019-06-02 ENCOUNTER — Encounter: Admit: 2019-06-02 | Discharge: 2019-06-02

## 2019-06-03 ENCOUNTER — Ambulatory Visit: Admit: 2019-06-03 | Discharge: 2019-06-03

## 2019-06-03 ENCOUNTER — Encounter: Admit: 2019-06-03 | Discharge: 2019-06-03

## 2019-06-03 DIAGNOSIS — C439 Malignant melanoma of skin, unspecified: Secondary | ICD-10-CM

## 2019-06-03 DIAGNOSIS — K2270 Barrett's esophagus without dysplasia: Secondary | ICD-10-CM

## 2019-06-03 DIAGNOSIS — R1310 Dysphagia, unspecified: Secondary | ICD-10-CM

## 2019-06-03 DIAGNOSIS — R634 Abnormal weight loss: Secondary | ICD-10-CM

## 2019-06-03 DIAGNOSIS — E43 Unspecified severe protein-calorie malnutrition: Secondary | ICD-10-CM

## 2019-06-03 DIAGNOSIS — C3491 Malignant neoplasm of unspecified part of right bronchus or lung: Secondary | ICD-10-CM

## 2019-06-03 DIAGNOSIS — C155 Malignant neoplasm of lower third of esophagus: Secondary | ICD-10-CM

## 2019-06-03 DIAGNOSIS — C4490 Unspecified malignant neoplasm of skin, unspecified: Secondary | ICD-10-CM

## 2019-06-03 DIAGNOSIS — C159 Malignant neoplasm of esophagus, unspecified: Secondary | ICD-10-CM

## 2019-06-03 DIAGNOSIS — J449 Chronic obstructive pulmonary disease, unspecified: Secondary | ICD-10-CM

## 2019-06-03 DIAGNOSIS — M549 Dorsalgia, unspecified: Secondary | ICD-10-CM

## 2019-06-08 ENCOUNTER — Encounter: Admit: 2019-06-08 | Discharge: 2019-06-08

## 2019-06-08 DIAGNOSIS — K2270 Barrett's esophagus without dysplasia: Secondary | ICD-10-CM

## 2019-06-08 DIAGNOSIS — R1310 Dysphagia, unspecified: Secondary | ICD-10-CM

## 2019-06-08 DIAGNOSIS — C159 Malignant neoplasm of esophagus, unspecified: Secondary | ICD-10-CM

## 2019-06-08 DIAGNOSIS — M549 Dorsalgia, unspecified: Secondary | ICD-10-CM

## 2019-06-08 DIAGNOSIS — R634 Abnormal weight loss: Secondary | ICD-10-CM

## 2019-06-08 DIAGNOSIS — C4490 Unspecified malignant neoplasm of skin, unspecified: Secondary | ICD-10-CM

## 2019-06-08 DIAGNOSIS — C439 Malignant melanoma of skin, unspecified: Secondary | ICD-10-CM

## 2019-06-08 DIAGNOSIS — J449 Chronic obstructive pulmonary disease, unspecified: Secondary | ICD-10-CM

## 2019-06-21 ENCOUNTER — Encounter: Admit: 2019-06-21 | Discharge: 2019-06-21 | Payer: BC Managed Care – PPO

## 2019-07-07 NOTE — Progress Notes
Patient accidentally pulled j tube out this morning.  Advised report to local ED for replacement.  [facility] did not have the same type of feeding tube. New tube hangs to patient's knees.  Order placed for IR j-tube exchange.  Pt scheduled for 10/8 at 0900. Advised to stop tube feeding 6 hours prior. Fortunato Curling, RN

## 2019-07-08 MED ORDER — DIATRIZOATE MEG-DIATRIZOAT SOD 66-10 % PO SOLN
30 mL | Freq: Once | GASTROSTOMY | 0 refills | Status: CP
Start: 2019-07-08 — End: ?
  Administered 2019-07-08: 15:00:00 30 mL via GASTROSTOMY

## 2019-07-08 NOTE — Patient Instructions
INTERVENTIONAL RADIOLOGY  Discharge Instructions?  ?Gastrostomy Tube (J-tube) Change?    AFTER THE PROCEDURE:  ? For routine J tube changes, resume normal feeding and flushing routine.  POST-PROCEDURE ACTIVITY:  ? Resume current activity  ? Never use scissors, pins, or other sharp objects near the tube.  ? ?Avoid bending, crimping or pulling on the tube.  ? Be sure your hands are clean when touching near the tube site or near the suture lock sites.  ? Do not use ointments, creams or powders around the site unless ordered by your physician.?  POST-PROCEDURE SITE CARE:? ?????  ? You may shower and you should gently clean around the tube site while in the shower using mild soap.? If you are unable to shower, continue to clean around the tube site and suture lock sites daily as described above.?  ? Do not submerge the tube site underwater (no tub bath, swimming/hot tub, etc.)?  DIET/MEDICATIONS:  ? Resume diet  ? Please see attached Medication Reconciliation Sheet for instructions on resuming your home medications.? If you are on blood thinning medications, you must contact your doctor who manages them to receive instructions on when to resume them and on when blood work should be done.?  WHEN TO CALL THE DOCTOR:? (Please call 911 for severe symptoms)  ? You have bleeding from the tube site or through the tube.  ? You are coughing up or vomiting blood or see blood in your stool.  ? You have severe pain at the site or increasing abdominal discomfort. (Some soreness at the site is normal for a few days.)  ? You have persistent nausea or vomiting.  ? You have signs of infection such as:  ?????????????? -Redness, swelling around the tube (a small area of redness is normal).  ?????????????? -Fever greater than 101?F.  ?????????????? -Pus-like drainage from around the tube.  ? You have a blocked tube, leaking around the tube site or the tube falls out. For any of the above symptoms or for problems or concerns related to the procedure,? call? [phone] for Monday-Friday 7-5.? After-hours and weekends, please call? [phone] and ask for the Interventional Radiology Resident on-call.

## 2019-07-08 NOTE — Other
Immediate Post Procedure Note    Date:  07/08/2019                                         Attending Physician:  Dr. Shelly Flatten, MD  Performing Provider:  Isabella Stalling, APRN-NP    Consent:  Consent obtained from patient.  Time out performed: Consent obtained, correct patient verified, correct procedure verified, correct site verified, patient marked as necessary.  Pre/Post Procedure Diagnosis:  Malfunctioning J tube  Indications:  Malfunctioning J tube    Anesthesia: None  Procedure(s):  J tube exchange  Findings:  Uneventful exchange of 6F J-tube. Will confirm placement with separate abdominal film s/p gastrograffin injection.       Estimated Blood Loss:  None/Negligible  Specimen(s) Removed/Disposition:  None  Complications: None  Patient Tolerated Procedure: Well  Post-Procedure Condition:  stable    Isabella Stalling, APRN-NP  Pager [phone]

## 2019-07-08 NOTE — Progress Notes
Patient arrives to [facility] procedure room to have J-tube examined and exchanged.  Site and focused assessment completed. 28F J-tube exchanged by [place] APRN without complication, pain free.  J-tube placement confirmed by contrasted AP/Lat ABD xray read by Dr. Charolette Forward. Pt given DC instructions.  Pt ambulates out on dc pain free.

## 2019-07-13 ENCOUNTER — Encounter: Admit: 2019-07-13 | Discharge: 2019-07-13 | Payer: BC Managed Care – PPO

## 2019-07-13 NOTE — Telephone Encounter
Per Dr. Eldred Manges, his oncologist closer to home would like patient to meet with Dr. Eldred Manges this week via telehealth. This RN requested most recent office/progress note from Dr. Wonda Cerise as well as recent imaging. (Fax #: [phone]). Fax confirmation received.    This RN attempted to call patient to let him know that this RN tentatively scheduled telehealth visit for him for 10/16. Told pt to call us if he needs to change that appt. Will attempt to call patient again to confirm.

## 2019-07-15 ENCOUNTER — Encounter: Admit: 2019-07-15 | Discharge: 2019-07-15 | Payer: BC Managed Care – PPO

## 2019-07-15 NOTE — Telephone Encounter
Sent urgent request for latest office note and imaging reports. Noted patient has appointment tomorrow, second request for records. Fax confirmation received.

## 2019-07-16 ENCOUNTER — Encounter: Admit: 2019-07-16 | Discharge: 2019-07-16 | Payer: BC Managed Care – PPO

## 2019-07-16 DIAGNOSIS — M549 Dorsalgia, unspecified: Secondary | ICD-10-CM

## 2019-07-16 DIAGNOSIS — K2270 Barrett's esophagus without dysplasia: Secondary | ICD-10-CM

## 2019-07-16 DIAGNOSIS — R634 Abnormal weight loss: Secondary | ICD-10-CM

## 2019-07-16 DIAGNOSIS — J449 Chronic obstructive pulmonary disease, unspecified: Secondary | ICD-10-CM

## 2019-07-16 DIAGNOSIS — C439 Malignant melanoma of skin, unspecified: Secondary | ICD-10-CM

## 2019-07-16 DIAGNOSIS — C159 Malignant neoplasm of esophagus, unspecified: Secondary | ICD-10-CM

## 2019-07-16 DIAGNOSIS — C4490 Unspecified malignant neoplasm of skin, unspecified: Secondary | ICD-10-CM

## 2019-07-16 DIAGNOSIS — R1310 Dysphagia, unspecified: Secondary | ICD-10-CM

## 2019-07-18 ENCOUNTER — Encounter: Admit: 2019-07-18 | Discharge: 2019-07-18 | Payer: BC Managed Care – PPO

## 2019-07-18 DIAGNOSIS — M549 Dorsalgia, unspecified: Secondary | ICD-10-CM

## 2019-07-18 DIAGNOSIS — C159 Malignant neoplasm of esophagus, unspecified: Secondary | ICD-10-CM

## 2019-07-18 DIAGNOSIS — C439 Malignant melanoma of skin, unspecified: Secondary | ICD-10-CM

## 2019-07-18 DIAGNOSIS — J449 Chronic obstructive pulmonary disease, unspecified: Secondary | ICD-10-CM

## 2019-07-18 DIAGNOSIS — K2270 Barrett's esophagus without dysplasia: Secondary | ICD-10-CM

## 2019-07-18 DIAGNOSIS — R634 Abnormal weight loss: Secondary | ICD-10-CM

## 2019-07-18 DIAGNOSIS — C4490 Unspecified malignant neoplasm of skin, unspecified: Secondary | ICD-10-CM

## 2019-07-18 DIAGNOSIS — R1310 Dysphagia, unspecified: Secondary | ICD-10-CM

## 2019-08-10 ENCOUNTER — Encounter: Admit: 2019-08-10 | Discharge: 2019-08-10 | Payer: BC Managed Care – PPO

## 2019-08-10 NOTE — Telephone Encounter
Spoke with Dr. Gwynneth Albright office. PET completed and to be faxed to [phone]. Bronchoscopy not scheduled or completed.

## 2019-08-13 ENCOUNTER — Encounter: Admit: 2019-08-13 | Discharge: 2019-08-13 | Payer: BC Managed Care – PPO

## 2019-08-13 NOTE — Telephone Encounter
Spoke with patient. Verbalized to patient we have received records of bronchoscopy and imaging, Dr. Eldred Manges wants tele health return prior to 12/3 visit with surgeon.

## 2019-08-31 ENCOUNTER — Encounter: Admit: 2019-08-31 | Discharge: 2019-08-31 | Payer: BC Managed Care – PPO

## 2019-09-01 ENCOUNTER — Encounter: Admit: 2019-09-01 | Discharge: 2019-09-01 | Payer: BC Managed Care – PPO

## 2019-09-01 DIAGNOSIS — C155 Malignant neoplasm of lower third of esophagus: Secondary | ICD-10-CM

## 2019-09-01 DIAGNOSIS — C439 Malignant melanoma of skin, unspecified: Secondary | ICD-10-CM

## 2019-09-01 DIAGNOSIS — C159 Malignant neoplasm of esophagus, unspecified: Secondary | ICD-10-CM

## 2019-09-01 DIAGNOSIS — R634 Abnormal weight loss: Secondary | ICD-10-CM

## 2019-09-01 DIAGNOSIS — M549 Dorsalgia, unspecified: Secondary | ICD-10-CM

## 2019-09-01 DIAGNOSIS — C4490 Unspecified malignant neoplasm of skin, unspecified: Secondary | ICD-10-CM

## 2019-09-01 DIAGNOSIS — K2270 Barrett's esophagus without dysplasia: Secondary | ICD-10-CM

## 2019-09-01 DIAGNOSIS — J449 Chronic obstructive pulmonary disease, unspecified: Secondary | ICD-10-CM

## 2019-09-01 DIAGNOSIS — R1310 Dysphagia, unspecified: Secondary | ICD-10-CM

## 2019-09-01 NOTE — Progress Notes
Date of Service: 09/01/2019      Subjective:             Reason for Visit:  Follow Up      Jason Garner is a 63 y.o. male         History of Present Illness    Obtained patient's verbal consent to treat them and their agreement to [facility] financial policy and NPP via this Zoom telehealth visit during the [facility] Emergency      Oncologic history is as summarized below.    Dysphagia - 1-2 months. Has to blend food. Has trouble with liquids. Lost 40 pounds x 2 months.     04/02/2019 EGD showed circumferential mass at the GE junction, causing near obstruction.  The scope could not be passed beyond.  It was dilated and the swelling improved partially.   Biopsy revealed an esophageal adenocarcinoma.  PD-L1 CPS score is equal to 5%.  HER-2/neu is 2+ by IHC.  FISH results pending.      CT chest abdomen pelvis on 04/08/2019.  Long segment circumferential distal esophageal wall thickening.  Pleural-parenchymal scarring with right within the right lung apex also measuring 1.9 cm spiculated suspicious mass.    04/20/2019.  PET scan No PET uptake in the right upper lobe lung nodule..  Along the distal third of the esophagus above the GE junction there is evidence of abnormal radiopharmaceutical uptake along with thickened portion of the esophagus.  SUV 14.3.  Large gastrohepatic lymph node 2.3 cm with SUV 10.3.   Calcified lymph nodes are consistent with previous granulomatous disease.  There is uptake in the subcarinal region adjacent to a calcified subcarinal lymph node SUV 5.1.    04/22/2019.  IR guided right lung core biopsy revealed a well-differentiated adenocarcinoma.  CK7 positive.  CK20 negative.  TTF-1 positive.  AE1/AE3 positive.  MART-1 negative.  The tumor demonstrates in part a lipidic pattern and is primary to the lung. 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  He is physically active. S/p FLOT x 4 cycles, Obtained PET/.CT, now s/p FLOT + Herceptin on Oct 26th via Dr. Donnajean Lopes    07/09/19 PET/CT with RUL nodule SUV 1.1-->1.4. Left hilar region LN with SUV increase 2.4 --> 5.4. Subcarinal LN not apparent  Decrease in GEJ mass SUV 14.3 --> 4.6. Epigastric LAD decrease in size and SUV 10.3 --> 2.2    Swallowing is further improving. Here to review Bronch findings and further pain.       Review of Systems   Constitutional: Positive for unexpected weight change. Negative for appetite change, chills, fatigue and fever.   HENT: Positive for trouble swallowing. Negative for sore throat.    Respiratory: Negative for cough and shortness of breath.    Cardiovascular: Negative for chest pain and leg swelling.   Gastrointestinal: Negative for abdominal pain, blood in stool, constipation, diarrhea, nausea and vomiting.   Genitourinary: Negative for dysuria and hematuria.   Musculoskeletal: Negative for back pain.   Skin: Negative for rash.   Neurological: Negative for headaches.   Psychiatric/Behavioral: Negative for sleep disturbance.             Medical History:   Diagnosis Date   ? Back pain    ? Barrett's esophagus    ? Cancer of esophagus (HCC)    ? COPD (chronic obstructive pulmonary disease) (HCC) 05/05/2019   ? Dysphagia    ? Esophageal cancer (HCC)    ?  Melanoma (HCC)    ? Skin cancer     Excision forehead, right   ? Weight loss, unintentional      Surgical History:   Procedure Laterality Date   ? UPPER GASTROINTESTINAL ENDOSCOPY  04/02/2019   ? DIAGNOSTIC LAPAROSCOPY OF ABDOMEN/ PERITONEUM/ OMENTUM WITH/ WITHOUT COLLECTION OF SPECIMENS BY BRUSHING/ WASHING LAPAROSCOPIC JEJUNOSTOMY N/A 05/07/2019    Performed by Bryson Dames, MD at [facility]   ? LAPAROSCOPIC JEJUNOSTOMY N/A 05/07/2019    Performed by Bryson Dames, MD at [facility]   ? COLONOSCOPY     ? SKIN CANCER EXCISION ? TONSILLECTOMY       Family History   Problem Relation Age of Onset   ? Stomach Cancer Father    ? Stroke Mother    ? Heart Attack Mother    ? Brain Cancer Sister    ? Heart Attack Sister    ? Lung Disease Sister    ? Mental Illness Sister    ? Suicide Brother    ? Other Brother         MVA     Social History     Socioeconomic History   ? Marital status: Single     Spouse name: Not on file   ? Number of children: Not on file   ? Years of education: Not on file   ? Highest education level: Not on file   Occupational History   ? Not on file   Tobacco Use   ? Smoking status: Current Every Day Smoker     Packs/day: 1.00     Years: 35.00     Pack years: 35.00     Types: Cigarettes   ? Smokeless tobacco: Current User     Types: Chew   ? Tobacco comment: 2-3 cigs a day   Substance and Sexual Activity   ? Alcohol use: Not Currently     Comment: occasionally    ? Drug use: Never   ? Sexual activity: Not on file   Other Topics Concern   ? Not on file   Social History Narrative   ? Not on file         Objective:         ? acetaminophen (TYLENOL) 160 mg/5 mL oral solution Take 20.3 mL by mouth every 6 hours as needed.   ? nicotine (NICODERM CQ STEP 1) 21 mg/day patch Apply 1 patch to top of skin as directed every 24 hours. Rotate patch location.   Indications: stop smoking   ? omeprazole DR (PRILOSEC) 20 mg capsule Take 20 mg by mouth daily before breakfast.   ? oxyCODONE (ROXICODONE) 5 mg tablet Take one tablet by mouth every 4 hours as needed for Pain  Take with food.   ? polyethylene glycol 3350 (MIRALAX) 17 g packet one packet by Per J Tube route twice daily.   ? senna (SENOKOT) 8.8 mg/5 mL oral syrup 10 mL by Per J Tube route twice daily.     Vitals:    09/01/19 1307   PainSc: Zero     There is no height or weight on file to calculate BMI.     Pain Score: Zero         Pain Addressed:  Current regimen working to control pain. Patient Evaluated for a Clinical Trial: Patient not eligible for a treatment trial (including not needing treatment, needs palliative care, in remission).     [place] Cooperative Oncology Group performance status is 1,  Restricted in physically strenuous activity but ambulatory and able to carry out work of a light or sedentary nature, e.g., light house work, office work.     Physical Exam  Vitals signs and nursing note reviewed.   Constitutional:       Appearance: Normal appearance. He is normal weight.   HENT:      Head: Normocephalic and atraumatic.      Nose: Nose normal.   Eyes:      Extraocular Movements: Extraocular movements intact.   Neurological:      Mental Status: He is alert and oriented to person, place, and time.   Psychiatric:         Mood and Affect: Mood normal.         Behavior: Behavior normal.         Thought Content: Thought content normal.         Judgment: Judgment normal.            CBC w/DIFF  CBC with Diff Latest Ref Rng & Units 05/09/2019 05/08/2019 05/05/2019 12/10/2012   WBC 4.5 - 11.0 K/UL 6.9 8.4 8.0 6.5   RBC 4.4 - 5.5 M/UL 4.15(L) 4.17(L) 4.64 4.73   HGB 13.5 - 16.5 GM/DL 04.5 13.4(L) 14.9 14.8   HCT 40 - 50 % 39.3(L) 39.6(L) 43.4 45.3   MCV 80 - 100 FL 94.7 95.0 93.5 95.8   MCH 26 - 34 PG 33.0 32.1 32.2 31.4   MCHC 32.0 - 36.0 G/DL 40.9 81.1 91.4 78.2   RDW 11 - 15 % 13.2 13.2 13.4 14.3   PLT 150 - 400 K/UL 212 230 274 279   MPV 7 - 11 FL 7.2 7.0 6.6(L) 7.6   NEUT 41 - 77 % 74 - - -   ANC 1.8 - 7.0 K/UL 5.14 - - -   LYMA 24 - 44 % 18(L) - - -   ALYM 1.0 - 4.8 K/UL 1.23 - - -   MONA 4 - 12 % 6 - - -   AMONO 0 - 0.80 K/UL 0.43 - - -   EOSA 0 - 5 % 1 - - -   AEOS 0 - 0.45 K/UL 0.07 - - -   BASA 0 - 2 % 1 - - -   ABAS 0 - 0.20 K/UL 0.05 - - -     Comprehensive Metabolic Profile  CMP Latest Ref Rng & Units 05/09/2019 05/08/2019 05/05/2019 12/10/2012   NA 137 - 147 MMOL/L 143 141 140 140   K 3.5 - 5.1 MMOL/L 4.1 4.2 3.9 4.2   CL 98 - 110 MMOL/L 106 107 104 110   CO2 21 - 30 MMOL/L 31(H) 25 25 25  GAP 3 - 12 6 9 11  5(L)   BUN 7 - 25 MG/DL 8 8 5(L) 10   CR 0.4 - 1.24 MG/DL 9.56 2.13 0.86 5.78   GLUX 70 - 100 MG/DL 469(G) 92 295(M) 841(L)   CA 8.5 - 10.6 MG/DL 8.7 8.8 9.6 9.2   TP 6.0 - 8.0 G/DL 2.4(M) - 7.1 6.9   ALB 3.5 - 5.0 G/DL 0.1(U) - 4.3 4.4   ALKP 25 - 110 U/L 74 - 80 67   ALT 7 - 56 U/L 6(L) - 9 22   TBILI 0.3 - 1.2 MG/DL 0.6 - 0.7 0.4   GFR >27 mL/min >60 >60 >60 >60   GFRAA >60 mL/min >60 >60 >60 >60       Tumor Markers  No results found for: CEA, CA199, CA125    Radiologic Examinations:  NM PET/CT EXTERNAL IMAGING  This order has been auto finalized and does not contain a result.  GENERAL RAD CHEST EXTERNAL IMAGING  This order has been auto finalized and does not contain a result.         Assessment and Plan:  This is 63 y.o. male with the following problems:       Stage III GEJ adenocarcinoma, HER2 positive  ? History of Barrett's esophagus  ? Dysphagia x 2 months. Lost 40 pounds x 2 months.   ? 04/02/2019 EGD showed circumferential mass at the GE junction, causing near obstruction.  The scope could not be passed beyond.  It was dilated and the swelling improved partially.   ? Biopsy revealed an esophageal adenocarcinoma.  PD-L1 CPS score is equal to 5%.  HER-2/neu is 2+ by IHC.  FISH results pending.  ? CT chest abdomen pelvis on 04/08/2019.  Long segment circumferential distal esophageal wall thickening.  Pleural-parenchymal scarring with right within the right lung apex also measuring 1.9 cm spiculated suspicious mass.  ? 04/20/2019.  PET scan No PET uptake in the right upper lobe lung nodule..  Along the distal third of the esophagus above the GE junction there is evidence of abnormal radiopharmaceutical uptake along with thickened portion of the esophagus.  SUV 14.3.  Large gastrohepatic lymph node 2.3 cm with SUV 10.3.  ?  Calcified lymph nodes are consistent with previous granulomatous disease.  There is uptake in the subcarinal region adjacent to a calcified subcarinal lymph node SUV 5.1. PLAN  Reviewed at [facility]. HER-2 positive disease and hence plan for neoadjuvant FLOT + Herceptin + Perjeta x 2 months as his PS is excellent. Then evlaute for resectino of GEJ tumor and lung cancer. Followed by adjuvant FLOT +/- antiHER2 agents.  S/p FLOT x 4 cycles via Dr. Donnajean Lopes  07/09/19 PET/CT with RUL nodule SUV 1.1-->1.4. Left hilar region LN with SUV increase 2.4 --> 5.4. Subcarinal LN not apparent  Decrease in GEJ mass SUV 14.3 --> 4.6. Epigastric LAD decrease in size and SUV 10.3 --> 2.2  S/p FLOT + Herceptin on Oct 26th (total 6 cycles). Tolerated well.  Dr. Donnajean Lopes arranged bronchoscopy to eval left hilar region LN. This was done on 07/30/19 and station 11L, 0.5 cm LN, FNA was negative for malignancy. Will obtain outside path report.  He is awaiting surgery. Will defer requirement for repeat scans prior to surgery to Dr. Seth Bake as cancer was responding and anticipate further response. If Dr. Seth Bake feels that we can proceed with resection without a more recent scan, that is reasonable from our stand point.   Surgery tentatively scheduled on 09/06/19. Will explore if lung AIS could be resected on 09/06/19 as well.  If surgery successful then patient will pursue adjuvant FLOT +/- Herceptin for at least 2 cycles via Dr. Donnajean Lopes.   RTC-Tele prn.      Dysphagia. Loss of weight.  This is from cancer.  He has significant loss of weight of 40 pounds. S/p J-tube placement by Dr. Derenda Fennel. He is gaining wt but swallowing better. Gaining wt.      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  04/22/2019.  IR guided right lung core biopsy revealed a well-differentiated adenocarcinoma.  CK7 positive.  CK20 negative.  TTF-1 positive.  AE1/AE3 positive.  MART-1 negative.  The tumor demonstrates in part a lepidic pattern and is primary to the lung.  Plan for  surgically resecting at the time of Esophagectomy versus definitive radiation (if resection is not feasible).    History of Barrett's.  He is on PPI. The patient/family was/were allowed to ask questions and voice concerns; these were addressed to the best of our ability. Patient/family expressed understanding of what was explained and agreed with the present plan. Patient/family has the phone numbers for the Cancer Center and was instructed on how to contact us with any questions or concerns.     Vanita Ingles MD

## 2019-09-01 NOTE — Progress Notes
This RN requested pathology report via fax from patient's bronchoscopy done on 07/30/2019. Fax request sent to Dr. Wonda Cerise [phone]).

## 2019-09-09 ENCOUNTER — Ambulatory Visit: Admit: 2019-09-09 | Discharge: 2019-09-09 | Payer: BC Managed Care – PPO

## 2019-09-09 ENCOUNTER — Encounter: Admit: 2019-09-09 | Discharge: 2019-09-09 | Payer: BC Managed Care – PPO

## 2019-09-09 DIAGNOSIS — K2270 Barrett's esophagus without dysplasia: Secondary | ICD-10-CM

## 2019-09-09 DIAGNOSIS — C4490 Unspecified malignant neoplasm of skin, unspecified: Secondary | ICD-10-CM

## 2019-09-09 DIAGNOSIS — C159 Malignant neoplasm of esophagus, unspecified: Secondary | ICD-10-CM

## 2019-09-09 DIAGNOSIS — Z01818 Encounter for other preprocedural examination: Secondary | ICD-10-CM

## 2019-09-09 DIAGNOSIS — C155 Malignant neoplasm of lower third of esophagus: Secondary | ICD-10-CM

## 2019-09-09 DIAGNOSIS — C439 Malignant melanoma of skin, unspecified: Secondary | ICD-10-CM

## 2019-09-09 DIAGNOSIS — C3491 Malignant neoplasm of unspecified part of right bronchus or lung: Secondary | ICD-10-CM

## 2019-09-09 DIAGNOSIS — R634 Abnormal weight loss: Secondary | ICD-10-CM

## 2019-09-09 DIAGNOSIS — J449 Chronic obstructive pulmonary disease, unspecified: Secondary | ICD-10-CM

## 2019-09-09 DIAGNOSIS — R221 Localized swelling, mass and lump, neck: Secondary | ICD-10-CM

## 2019-09-09 DIAGNOSIS — M549 Dorsalgia, unspecified: Secondary | ICD-10-CM

## 2019-09-09 DIAGNOSIS — R1310 Dysphagia, unspecified: Secondary | ICD-10-CM

## 2019-09-09 MED ORDER — RP DX F-18 FDG MCI
10 | Freq: Once | INTRAVENOUS | 0 refills | Status: CP
Start: 2019-09-09 — End: ?
  Administered 2019-09-09: 21:00:00 10.1 via INTRAVENOUS

## 2019-09-09 NOTE — Patient Instructions
IR will contact you to schedule neck biopsy.  Our pre-op nurse will instruct you regarding Covid testing prior to surgery.     You will be contacted on 12/22 with further instructions and arrival time for 12/23.     What You Should Do:  Three days before your surgery: 09/19/2019  Begin consuming a full liquid diet.  You may refer to the full liquid diet section in this handout for guidelines.  In general, this should consist of pudding, soups, gelatin, yogurt, milkshakes, Boost or other   protein supplements, and Cream of Wheat.  You should remain on a full liquid diet until the day before your operation.     What You Should Do:  The day before your surgery: 09/21/2019  Begin a clear liquid diet.  You may refer to the clear liquid diet section in this handout for guidelines.  This includes broth, juice, coffee, or tea.  You will need to get two ( ) bottles of Magnesium Citrate a liquid solution that causes colon cleansing. These can be purchased over the counter at any local pharmacy. Begin drinking it around 10:00 am the morning of the day before your surgery.      Barrier Cream such as A&D ointment can be used on your bottom to prevent skin breakdown from the acid in the bowel as we cleanse your colon.      After midnight, you are not allowed to eat or drink anything by mouth (this includes mint and gum).  If you take certain medications that should not be missed, please consult with your physician.     Full-liquid Diet  You will begin this diet on 09/19/2019  Examples  ? Yogurt (may be any flavor, no added fruits or nuts)  ? Pudding, mousse, custard (sugar-free)  ? Ice cream  ? Milk (skim milk, 2% , soy, or lactose-free)  ? Popsicles  ? Strained cream soups (no tomato or soup with pieces of vegetables)  ? Nutritional supplement drinks (i.e. Boost, Ensure, or Glucerna).  This is highly recommended between meals: 3 ounces two times per day.  These supplements are high in calories and essential nutrients. ? Shakes or smoothies (sugar-free, no fruit added)  ? Cream of wheat or cream of rice cereal  ? Protein powders (whey or soy protein) may be added to any of the above foods.  These can be purchased at any health food store and at some grocery stores.  Your body needs protein to heal, so this is a recommended supplement.        Clear-Liquid Diet  You will begin this diet 09/21/2019       Nothing to eat or drink after midnight.   ? Sugar-free drinks   ? Water  ? Clear broth (chicken, beef, and vegetable)  ? Decaffeinated tea or coffee (without cream)    Avoid carbonated beverages and Jello.     If you have any questions please call our office at [phone]

## 2019-09-11 NOTE — Patient Education
Dear Jason Garner,    Thank you for choosing [facility] Interventional Radiology for your procedure. Your appointment information is listed below:    Appointment Date: 09/13/2019  Appointment Time: 2PM  Arrival Time: 1PM  Location:     ? [employer]: [address], [place], [place]  [address]  Parking: [address] Park[address]age    INTERVENTIONAL RADIOLOGY  PRE-PROCEDURE [place] are scheduled for a procedure in Interventional Radiology.  Please follow these instructions and any direction from your Primary Care/Managing Physician.  If you have questions about your procedure or need to reschedule please call [phone].      Medication Instructions:  Continue scheduled medication.       Diet Instructions: Maintain regular diet with no restrictions.     Day of Exam Instructions:  1. Bathe or shower with an antibacterial soap prior to your appointment.  2. Bring a list of your current medications and the dosages.  3. Wear comfortable clothing and leave valuables at home.  4. Arrive (1) hour prior to your appointment.  This time will be spent registering, interviewing, assessing, educating, and preparing you for the test.  ? You will be with [place] anywhere from 30 minutes to 2 hours after your exam depending on your procedure.  5. Depending on the procedure and as instructed by the nurse a responsible adult may be required to drive you home (no [place] Popper, taxis or buses are allowed). If a driver is required and you do not have one  we will be unable to perform your procedure.   6. The nurse will give you discharge instructions about your care and activities after the procedure.

## 2019-09-11 NOTE — Progress Notes
Interventional Radiology Outpatient Scheduling Checklist      1.  Name of Procedure(s):  US Guided Right Neck Mass Biopsy     09/10/2019 3:46 PM CST by Corwin Levins, RN     Approved? Yes   Attending Provider Reviewing the Case: Evelene Croon   Case Type: Neuro   Modality: Ultrasound   Position: Supine   Sedation Type: Local   Preferred Location: [place]   Physician: Any   Comments Approved by Onalee Hua           2.  Date of Procedure:   09/13/2019      3.  Arrival Time:   1300      4.  Procedure Time:  9518      8.  Correct Procedural Room Assignment:  IR CA Room #1      6.  Blood Thinners Triaged and instructed per protocol: Y/N/NA:  NA  Confirmed accurate instructions sent to patient: Y/N:  NA       7.  Procedure Order Verified: Y/N:  Yes      9.  Patient instructed to have a driver: Y/N/NA: NO    10.  Patient instructed on NPO status: Y/N/NA: NO Restrictions  Confirmed accurate instructions sent to patient: Y/N:  Yes    11.  Specimen needed: Y/N/NA:  Yes   Verified Order placed: Y/N:  Yes    12.  Allergies Verified:  Y/N:  Yes    13.  Is there an Iodine Allergy: Y/N:  No  Does the Procedure Require contrast: Y/N:  NO  If so, was the IR- Contrast Allergy Pre-Procedure Medication protocol ordered: Y/NA:  NA    14.  Does the patient have labs according to IR Pre-procedure Laboratory Parameter policy: Y/N/NA:  Yes  If No, was the patient instructed to obtain labs prior to procedure: Y/N/NA:  NA     15.  Will the patient need to be admitted or have a possible admission: Y/N:  No  If yes, confirmed accurate instructions sent to patient: Y/N/NA:  NA     16.  Patient States Understanding: Y/N:  Yes    17.  History of OSA:  Y/N:  No  If yes, confirm request to bring CPAP sent to patient: Y/N/NA:  NA    18. Patient declines electronic procedure instructions: Y/N:  No

## 2019-09-13 ENCOUNTER — Ambulatory Visit: Admit: 2019-09-13 | Discharge: 2019-09-13 | Payer: BC Managed Care – PPO

## 2019-09-13 ENCOUNTER — Encounter: Admit: 2019-09-13 | Discharge: 2019-09-13 | Payer: BC Managed Care – PPO

## 2019-09-13 DIAGNOSIS — C439 Malignant melanoma of skin, unspecified: Secondary | ICD-10-CM

## 2019-09-13 DIAGNOSIS — R1310 Dysphagia, unspecified: Secondary | ICD-10-CM

## 2019-09-13 DIAGNOSIS — R221 Localized swelling, mass and lump, neck: Secondary | ICD-10-CM

## 2019-09-13 DIAGNOSIS — R634 Abnormal weight loss: Secondary | ICD-10-CM

## 2019-09-13 DIAGNOSIS — M549 Dorsalgia, unspecified: Secondary | ICD-10-CM

## 2019-09-13 DIAGNOSIS — C4490 Unspecified malignant neoplasm of skin, unspecified: Secondary | ICD-10-CM

## 2019-09-13 DIAGNOSIS — J449 Chronic obstructive pulmonary disease, unspecified: Secondary | ICD-10-CM

## 2019-09-13 DIAGNOSIS — K2270 Barrett's esophagus without dysplasia: Secondary | ICD-10-CM

## 2019-09-13 DIAGNOSIS — C159 Malignant neoplasm of esophagus, unspecified: Secondary | ICD-10-CM

## 2019-09-13 DIAGNOSIS — C155 Malignant neoplasm of lower third of esophagus: Secondary | ICD-10-CM

## 2019-09-13 DIAGNOSIS — C3491 Malignant neoplasm of unspecified part of right bronchus or lung: Secondary | ICD-10-CM

## 2019-09-13 NOTE — Other
Immediate Post Procedure Note    Date:  09/13/2019                                         Attending Physician:   Evelene Croon  Performing Provider:  Rhunette Croft, MD    Consent:  Consent obtained from patient.  Time out performed: Consent obtained, correct patient verified, correct procedure verified, correct site verified, patient marked as necessary.  Pre/Post Procedure Diagnosis:  Esophageal cancer, concern for R lower cervical nodal mets  Indications:  As above    Anesthesia: Local lidocaine  Procedure(s):  US guided FNA of lower R cervical lymph node  Findings:  2 FNA passes obtained     Estimated Blood Loss:  None/Negligible  Specimen(s) Removed/Disposition:  Yes, sent to pathology and Cytology present  Complications: None  Patient Tolerated Procedure: Well  Post-Procedure Condition:  stable    Rhunette Croft, MD  Pager [phone]

## 2019-09-13 NOTE — Progress Notes
Pt recovery complete.     Ambulatory status: Ambulatory.   Vitals stable. Dressing is clean, dry, and intact.  Pain level returned to baseline.  Discharge instructions reviewed with: patient  AVS signed and provided to patient.    Pt discharged to main lobby via wheelchair transport.

## 2019-09-13 NOTE — H&P (View-Only)
Pre Procedure History and Physical/Sedation Plan    Procedure Date: 09/13/2019     Planned Procedure(s):  Right neck mass biopsy  Indication: Right neck mass  __________________________________________________________________    Chief Complaint: Right neck mass    History of Present Illness: Jason Garner is a 63 y.o. male. Pt presents to IR for above procedure    Patient Active Problem List    Diagnosis Date Noted   ? Severe malnutrition (HCC) 05/08/2019     Class: Acute   ? Abnormal weight loss 05/05/2019   ? Dysphagia, unspecified 05/05/2019   ? Other spondylosis, lumbosacral region 05/05/2019   ? Malignant neoplasm of lower third of esophagus (HCC) 05/05/2019   ? COPD (chronic obstructive pulmonary disease) (HCC) 05/05/2019   ? Adenocarcinoma of right lung (HCC) 05/05/2019     Medical History:   Diagnosis Date   ? Back pain    ? Barrett's esophagus    ? Cancer of esophagus (HCC)    ? COPD (chronic obstructive pulmonary disease) (HCC) 05/05/2019   ? Dysphagia    ? Esophageal cancer (HCC)    ? Melanoma (HCC)    ? Skin cancer     Excision forehead, right   ? Weight loss, unintentional       Surgical History:   Procedure Laterality Date   ? UPPER GASTROINTESTINAL ENDOSCOPY  04/02/2019   ? DIAGNOSTIC LAPAROSCOPY OF ABDOMEN/ PERITONEUM/ OMENTUM WITH/ WITHOUT COLLECTION OF SPECIMENS BY BRUSHING/ WASHING LAPAROSCOPIC JEJUNOSTOMY N/A 05/07/2019    Performed by Bryson Dames, MD at [facility]   ? LAPAROSCOPIC JEJUNOSTOMY N/A 05/07/2019    Performed by Bryson Dames, MD at [facility]   ? COLONOSCOPY     ? SKIN CANCER EXCISION     ? TONSILLECTOMY        Medications Prior to Admission   Medication Sig Dispense Refill Last Dose   ? acetaminophen (TYLENOL) 160 mg/5 mL oral solution Take 20.3 mL by mouth every 6 hours as needed. 240 mL  >1 Month   ? nicotine (NICODERM CQ STEP 1) 21 mg/day patch Apply 1 patch to top of skin as directed every 24 hours. Rotate patch location.   Indications: stop smoking   >1 Month ? omeprazole DR (PRILOSEC) 20 mg capsule Take 20 mg by mouth daily before breakfast.   >1 Month   ? oxyCODONE (ROXICODONE) 5 mg tablet Take one tablet by mouth every 4 hours as needed for Pain  Take with food. 20 tablet 0 >1 Month   ? polyethylene glycol 3350 (MIRALAX) 17 g packet one packet by Per J Tube route twice daily. 12 each  >1 Month   ? senna (SENOKOT) 8.8 mg/5 mL oral syrup 10 mL by Per J Tube route twice daily.   >1 Month     Allergies   Allergen Reactions   ? Cephalexin RASH     Per patient has tolerated penicillins before       Social History:   Social History     Tobacco Use   ? Smoking status: Current Every Day Smoker     Packs/day: 0.50     Years: 35.00     Pack years: 17.50     Types: Cigarettes   ? Smokeless tobacco: Current User     Types: Chew   ? Tobacco comment: 2-3 cigs a day   Substance Use Topics   ? Alcohol use: Not Currently     Comment: occasionally       Family  History   Problem Relation Age of Onset   ? Stomach Cancer Father    ? Stroke Mother    ? Heart Attack Mother    ? Brain Cancer Sister    ? Heart Attack Sister    ? Lung Disease Sister    ? Mental Illness Sister    ? Suicide Brother    ? Other Brother         MVA        Review of Systems  See HPI    Previous Anesthetic/Sedation History:  N/A-procedure done with local anesthetic    Physical Exam:  Vital Signs: Last Filed In 24 Hours Vital Signs: 24 Hour Range   BP: 132/69 (12/14 1326)  Temp: 36.4 ?C (97.6 ?F) (12/14 1326)  Pulse: 78 (12/14 1326)  Respirations: 19 PER MINUTE (12/14 1326)  SpO2: 100 % (12/14 1326)  SpO2 Pulse: 78 (12/14 1326) BP: (132)/(69)   Temp:  [36.4 ?C (97.6 ?F)]   Pulse:  [78]   Respirations:  [19 PER MINUTE]   SpO2:  [100 %]             General appearance: alert  Neurologic: Grossly normal  Lungs: Nonlabored  Extremities: Grossly normal       Anesthesia Classification:  ASA II (A patient with a severe systemic disease that limits activity, but is not incapacitating) Sedation/Medication Plan: Local anesthetic  Medications for Reversal: N/A-local anesthetic  Discussion/Reviews:  Physician has discussed risks and alternatives of this type of sedation and above planned procedures with patient  NPO Status: N/A Local anesthetic        Lab/Radiology/Other Diagnostic Tests:  Labs:  No pertinent labs to review           Anson Crofts, [facility]  Pager [phone]

## 2019-09-13 NOTE — Progress Notes
Pt arrived to IR pre/post for procedure work up.     Mobility status: Ambulatory  Procedure and discharge instructions reviewed and pt verbalizes understanding.   Pt cart locked in low position, call light within reach.   See doc flowsheets for further details.    Will continue to monitor patient.

## 2019-09-13 NOTE — Patient Instructions
INTERVENTIONAL RADIOLOGY DISCHARGE INSTRUCTIONS  PERCUTANEOUS BIOPSY   A percutaneous biopsy is a procedure in which a tiny sample of tissue is taken by using a needle inserted through your skin (percutaneously) into the affected area.? The radiologist will determine whether the procedure is to be done using ultrasound or CT guidance.? Your biopsy is from the following area:?_______________________________________________  POST-PROCEDURE ACTIVITY:  ? A responsible adult must drive you home after the procedure.? You should not drive or operate heavy machinery or do anything that requires concentration for at least 24 hours after receiving sedation or anesthesia.  ? It is recommended that a responsible adult be with you until tomorrow morning.  ? Other? activity restrictions such as lifting restrictions will depend upon the biopsy area and will be determined by the physician after your procedure:?________________________________________________________?????????????  ?POST-PROCEDURE SITE CARE:  ? You will have a small bandage over the site.? Keep this dry.? You may remove it in 24 hours.  ? You may shower in 24 hours, after removing the bandage.  ? Do not submerge the site underwater for 1 week (no tub bath, swimming/hot tub, etc.)  ? Be sure your hands are clean when touching near the site.  ? Do not use ointments, creams or powders on the puncture site.?  DIET/MEDICATIONS:  ? You may resume your previous diet after the procedure unless otherwise restricted.  ? If you receive sedation or narcotic pain medications, avoid any foods or beverages containing alcohol for at least 24 hours after the procedure.  ? Please see the Medication Reconciliation sheet for instructions on resuming your home medications.?  CALL THE DOCTOR IF:?  ? Bright red blood has soaked the bandage.  ? You have severe pain not relieved by medication.? Some soreness or tenderness at the site is to be expected for several days. ? You have signs of infection such as: Chills, fever greater than 101?F, body aches, redness, swelling or warmth at the puncture site or pus draining from the site.??  You or your caregiver should call 911 if you have any severe symptoms such as excessive bleeding, severe dizziness, trouble breathing or loss of consciousness.  For any of the above symptoms or for problems or concerns related to the procedure,? call? [phone] for Monday-Friday 7-5.? After-hours and weekends, please call? [phone] and ask for the Interventional Radiology Resident on-call.

## 2019-09-14 ENCOUNTER — Encounter: Admit: 2019-09-14 | Discharge: 2019-09-14 | Payer: BC Managed Care – PPO

## 2019-09-14 ENCOUNTER — Inpatient Hospital Stay: Admit: 2019-09-14 | Discharge: 2019-09-14 | Payer: BC Managed Care – PPO

## 2019-09-14 DIAGNOSIS — Z20828 Contact with and (suspected) exposure to other viral communicable diseases: Secondary | ICD-10-CM

## 2019-09-14 DIAGNOSIS — M549 Dorsalgia, unspecified: Secondary | ICD-10-CM

## 2019-09-14 DIAGNOSIS — J449 Chronic obstructive pulmonary disease, unspecified: Secondary | ICD-10-CM

## 2019-09-14 DIAGNOSIS — C3411 Malignant neoplasm of upper lobe, right bronchus or lung: Secondary | ICD-10-CM

## 2019-09-14 DIAGNOSIS — R1310 Dysphagia, unspecified: Secondary | ICD-10-CM

## 2019-09-14 DIAGNOSIS — C4490 Unspecified malignant neoplasm of skin, unspecified: Secondary | ICD-10-CM

## 2019-09-14 DIAGNOSIS — C159 Malignant neoplasm of esophagus, unspecified: Secondary | ICD-10-CM

## 2019-09-14 DIAGNOSIS — C439 Malignant melanoma of skin, unspecified: Secondary | ICD-10-CM

## 2019-09-14 DIAGNOSIS — R634 Abnormal weight loss: Secondary | ICD-10-CM

## 2019-09-14 DIAGNOSIS — C155 Malignant neoplasm of lower third of esophagus: Secondary | ICD-10-CM

## 2019-09-14 DIAGNOSIS — K2270 Barrett's esophagus without dysplasia: Secondary | ICD-10-CM

## 2019-09-16 ENCOUNTER — Encounter: Admit: 2019-09-16 | Discharge: 2019-09-16 | Payer: BC Managed Care – PPO

## 2019-09-16 NOTE — Progress Notes
I spoke with Mr. Jason Garner yesterday evening and informed him of the results of his FNA of the right neck lesion.  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  I spoke with the patient as well as with the referring oncologist.  The patient is not going to be rendered free of malignancy with esophagectomy.  He understands that the area involved is out of the usual field of dissection.  This really represents M1 disease.  The patient will undergo potential stereotactic radiotherapy to the lung lesion which represents a primary lung cancer.  If the patient with continued chemotherapy has no other oligometastatic disease, we can certainly consider stereotactic radiotherapy to the nodal metastases in the neck.

## 2019-09-16 NOTE — Progress Notes
Dr. Ricky Ala updated patient with biopsy results.  Surgery cancelled.  Results forwarded to Dr. Nancy Fetter and Dr. Wonda Cerise. Fortunato Curling, RN

## 2019-09-22 ENCOUNTER — Encounter: Admit: 2019-09-22 | Discharge: 2019-09-22 | Payer: BC Managed Care – PPO

## 2020-03-24 ENCOUNTER — Encounter: Admit: 2020-03-24 | Discharge: 2020-03-24 | Payer: BC Managed Care – PPO

## 2020-03-24 DIAGNOSIS — C3491 Malignant neoplasm of unspecified part of right bronchus or lung: Secondary | ICD-10-CM

## 2020-03-24 DIAGNOSIS — K9419 Other complications of enterostomy: Secondary | ICD-10-CM

## 2020-03-24 DIAGNOSIS — E43 Unspecified severe protein-calorie malnutrition: Secondary | ICD-10-CM

## 2020-03-24 DIAGNOSIS — C155 Malignant neoplasm of lower third of esophagus: Secondary | ICD-10-CM

## 2020-03-24 NOTE — Progress Notes
Interventional Radiology Outpatient Procedure Review Assessment        Reason for consult: Evaluate for Jejunostomy tube reposition/exchange in setting of previously replaced jejunostomy on 10/8.      History of present illness:  Jason Garner is a 64 y.o. 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      Assessment:   - Review of imaging: IR Gastrostomy tube .   - Review of recent labs and allergies: Reviewed, allergy to cephalexin.  - Review of anticoagulation medications: None  - Previous ASA score if available: III (If ASA IV, anesthesia will run sedation for procedure if sedation indicated).  - Previous IR Anesthetic/Sedation History (if applicable): Moderate  - Case discussed with referring Physician (Y/N): No  - Contraindications to procedure: None      Plan:  - This case has been reviewed and approved for fluoroscopic guided jejunostomy tube reposition/replacement  - Patient position and modality: Supine and fluoroscopy  - Intra-procedural Sedation/Medication Plan: Fentanyl and Midazolam  - Procedure time frame: <10 days, 10-14 days, 14-30 days  - Anticoagulation: Per IR peri procedural anticoagulation management protocol  - Labs: Per IR pre procedural lab protocol  - Additional requests after review: None    Lab/Radiology/Other Diagnostic Tests:  Labs:  Pertinent labs reviewed             Rene Paci, MD

## 2020-03-27 ENCOUNTER — Encounter: Admit: 2020-03-27 | Discharge: 2020-03-27 | Payer: BC Managed Care – PPO

## 2020-06-30 DEATH — deceased

## 2021-10-23 IMAGING — US VDUPLELT
1 series · 14 of 25 positions shown · non-contrast
Comparison: none

[Series 1: us venous duplex low ext left · portal-venous · 14 of 37 slices shown]
[im 1/37]
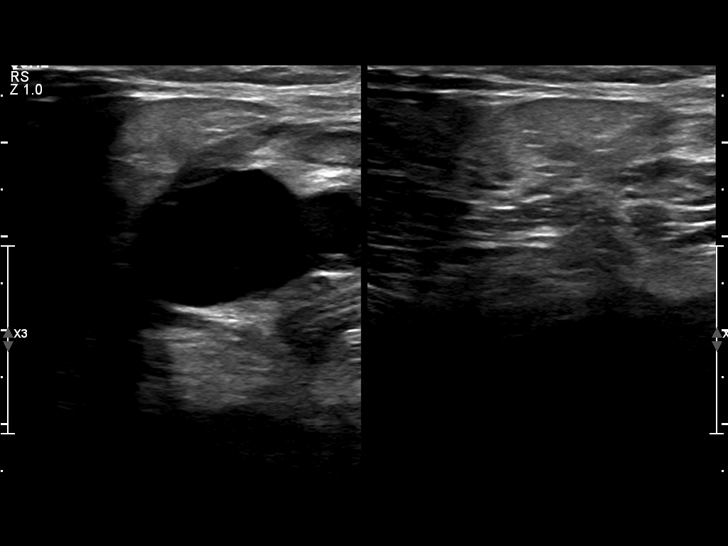
[im 4/37]
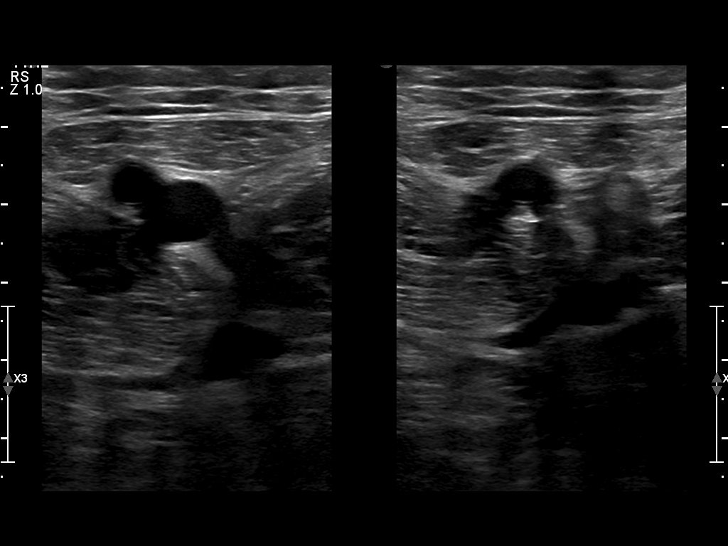
[im 7/37]
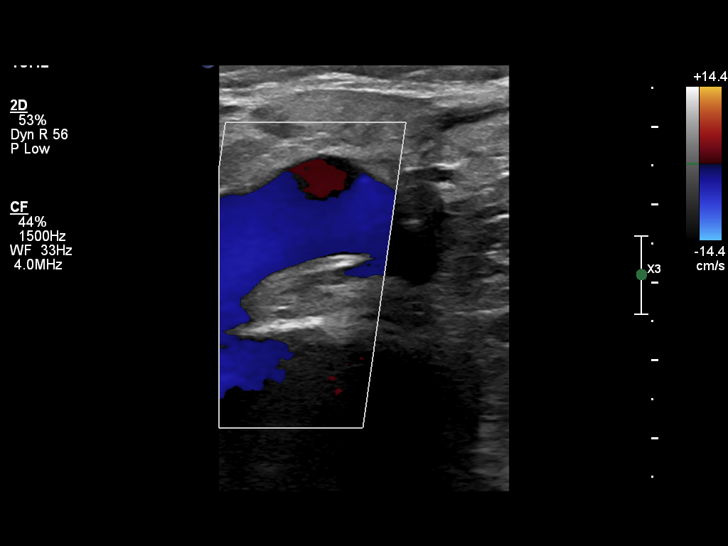
[im 10/37]
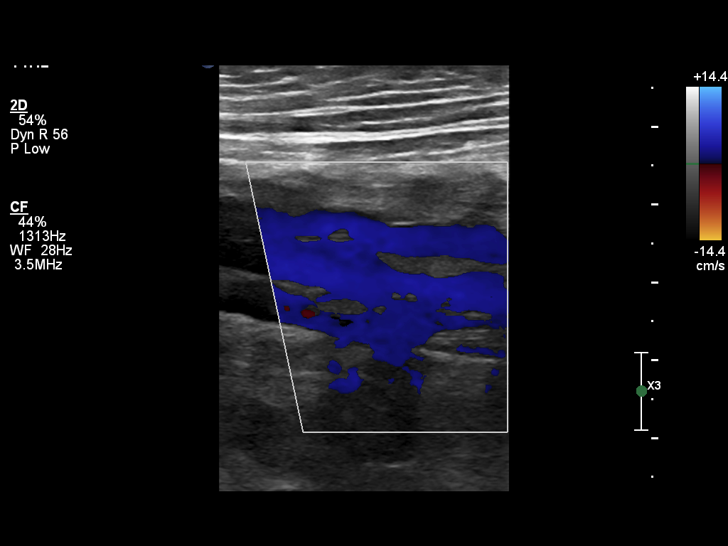
[im 13/37]
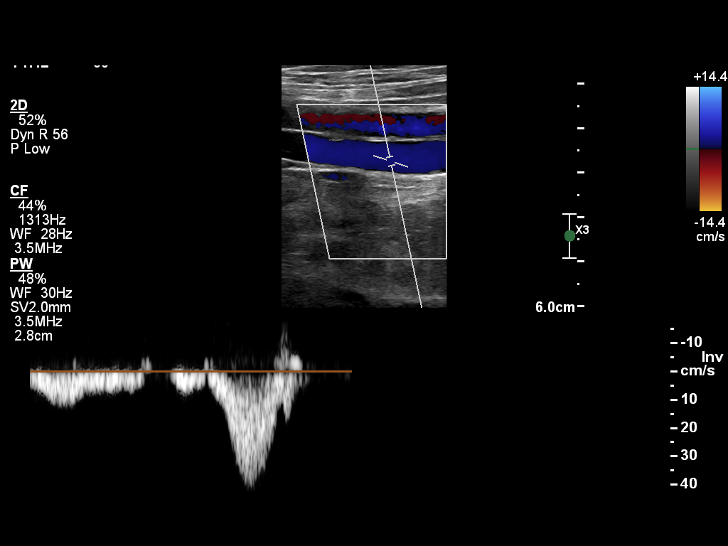
[im 14/37]
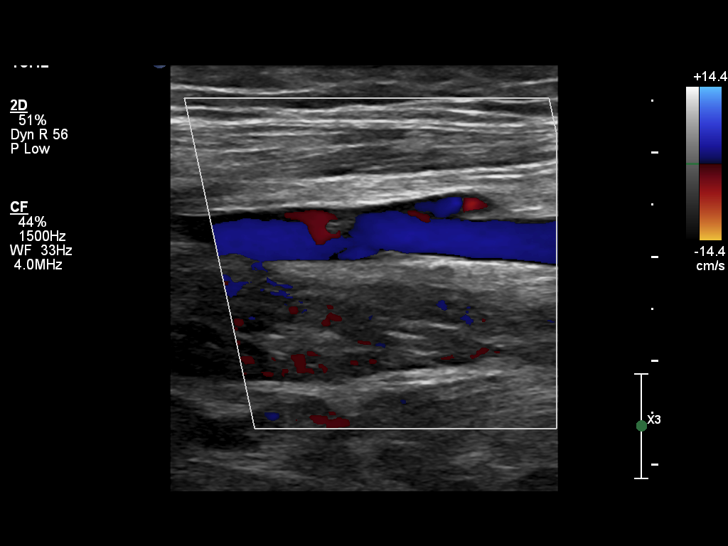
[im 17/37]
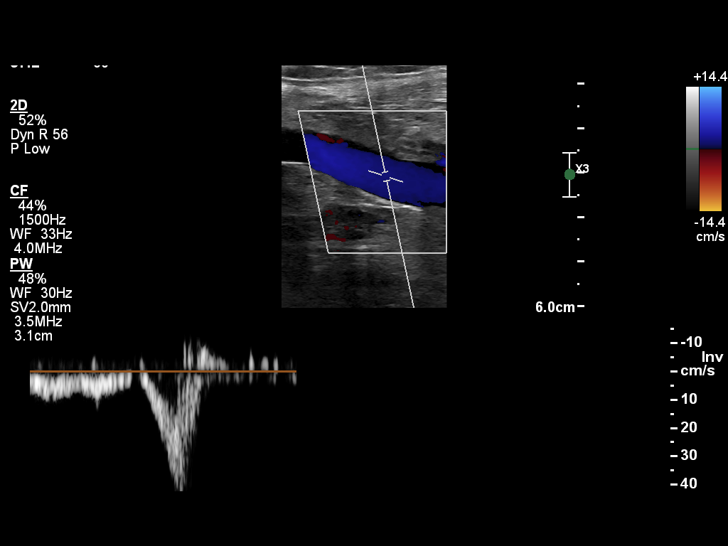
[im 20/37]
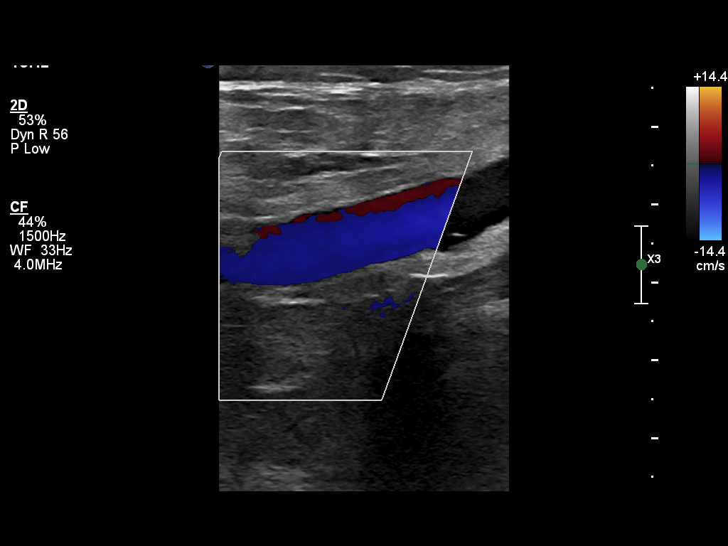
[im 23/37]
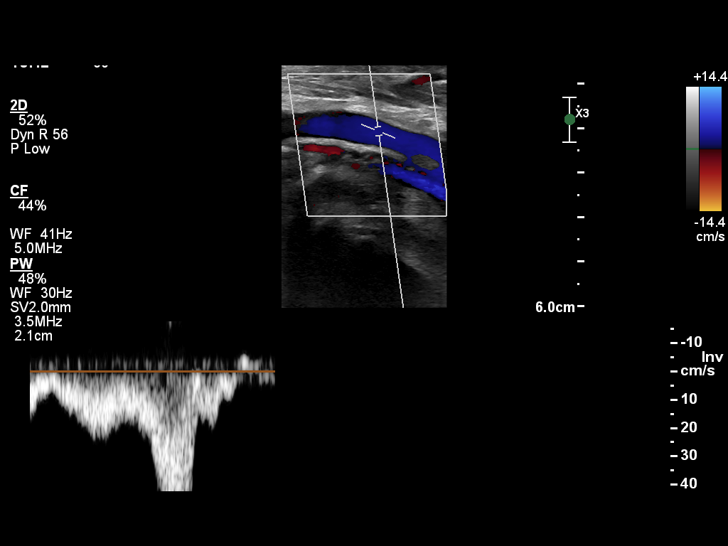
[im 25/37]
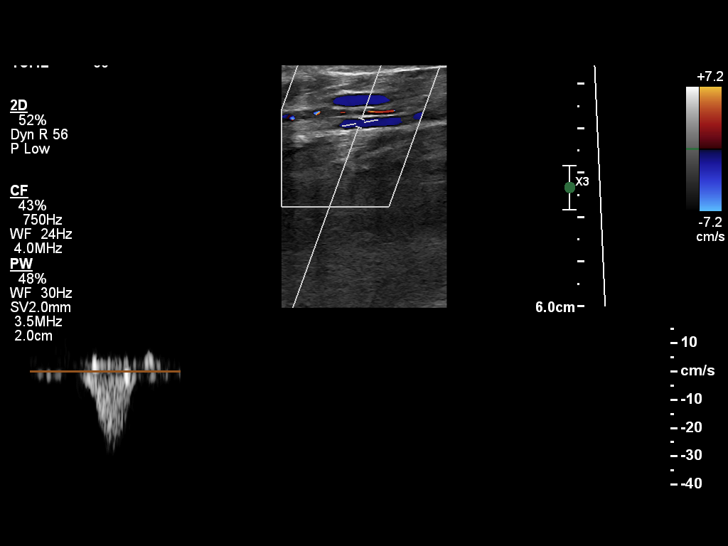
[im 28/37]
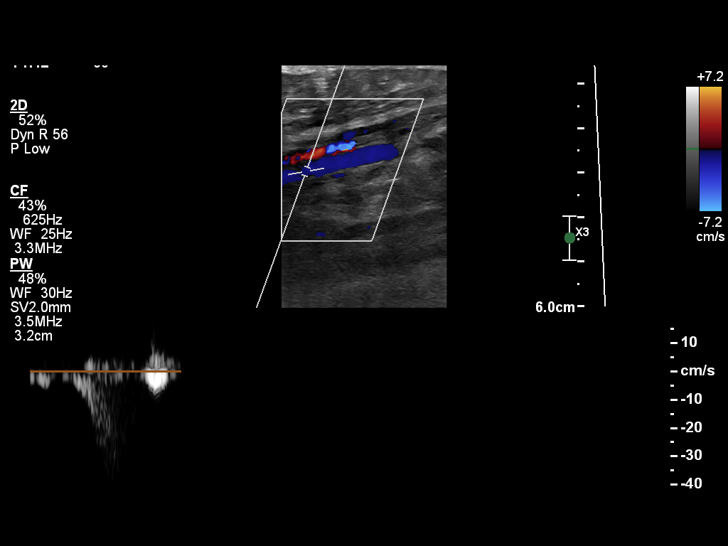
[im 31/37]
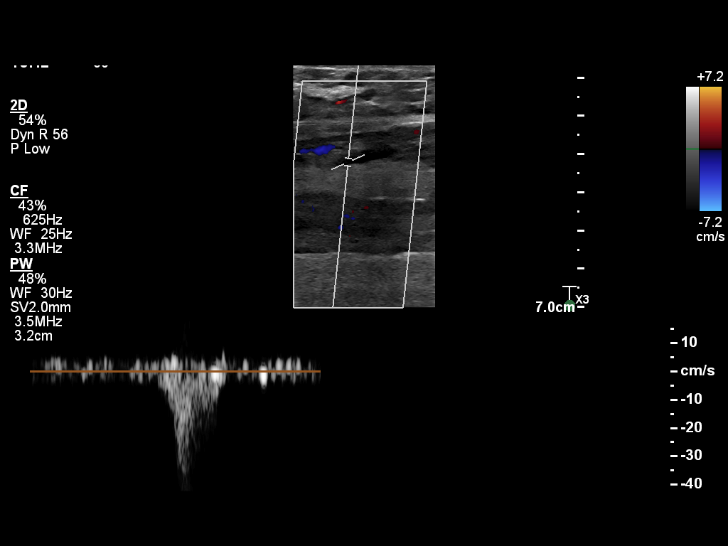
[im 34/37]
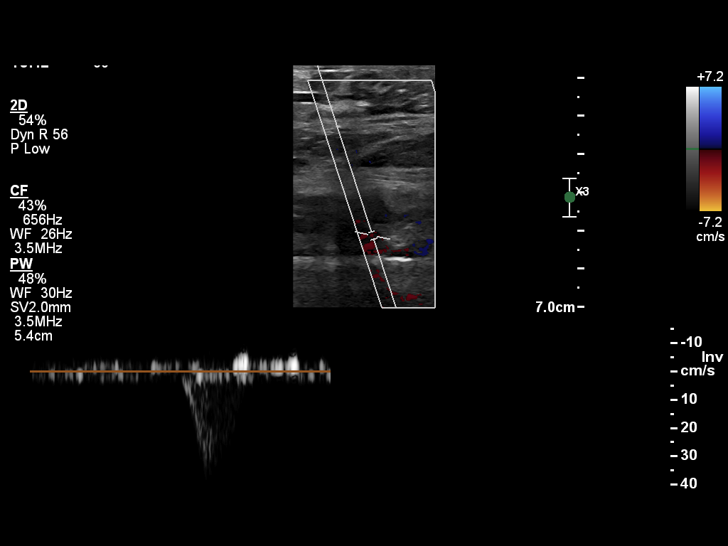
[im 37/37]
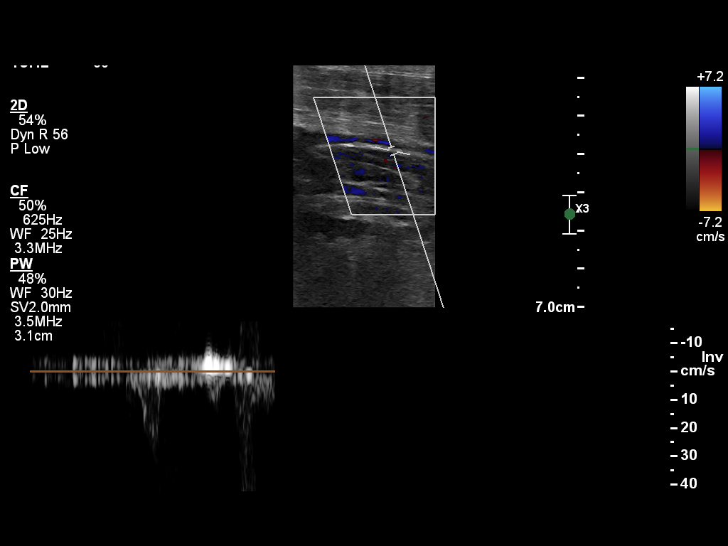

[14 of 25 positions shown; findings below may reference images not displayed]

EXAM

US venous duplex low ext LT

INDICATION

Leg swelling, R/O DVT

TECHNIQUE

US venous duplex low ext LT

COMPARISONS

None available

FINDINGS

There is no evidence of left lower extremity DVT.

IMPRESSION

No left lower extremity DVT.

Tech Notes:

## 2023-10-01 DEATH — deceased
